# Patient Record
Sex: Female | Born: 1965 | Race: Black or African American | Hispanic: No | Marital: Married | Smoking: Never smoker
Health system: Southern US, Community
[De-identification: ages and names within clinical notes are randomized; demographics above are authoritative.]

## PROBLEM LIST (undated history)

## (undated) DIAGNOSIS — E7800 Pure hypercholesterolemia, unspecified: Secondary | ICD-10-CM

## (undated) DIAGNOSIS — E119 Type 2 diabetes mellitus without complications: Secondary | ICD-10-CM

## (undated) DIAGNOSIS — F319 Bipolar disorder, unspecified: Secondary | ICD-10-CM

## (undated) DIAGNOSIS — F32A Depression, unspecified: Secondary | ICD-10-CM

## (undated) DIAGNOSIS — F419 Anxiety disorder, unspecified: Secondary | ICD-10-CM

## (undated) HISTORY — DX: Pure hypercholesterolemia, unspecified: E78.00

## (undated) HISTORY — DX: Type 2 diabetes mellitus without complications: E11.9

## (undated) HISTORY — PX: ABDOMINAL HYSTERECTOMY: SHX81

## (undated) HISTORY — DX: Depression, unspecified: F32.A

---

## 2018-01-11 DIAGNOSIS — K219 Gastro-esophageal reflux disease without esophagitis: Secondary | ICD-10-CM | POA: Insufficient documentation

## 2018-01-11 DIAGNOSIS — F319 Bipolar disorder, unspecified: Secondary | ICD-10-CM | POA: Insufficient documentation

## 2018-09-01 DIAGNOSIS — E1165 Type 2 diabetes mellitus with hyperglycemia: Secondary | ICD-10-CM | POA: Insufficient documentation

## 2018-10-26 DIAGNOSIS — E782 Mixed hyperlipidemia: Secondary | ICD-10-CM | POA: Insufficient documentation

## 2021-07-02 ENCOUNTER — Ambulatory Visit: Payer: Self-pay | Admitting: Urology

## 2021-07-08 ENCOUNTER — Ambulatory Visit (INDEPENDENT_AMBULATORY_CARE_PROVIDER_SITE_OTHER): Payer: Medicare Other | Admitting: Urology

## 2021-07-08 ENCOUNTER — Encounter: Payer: Self-pay | Admitting: Urology

## 2021-07-08 ENCOUNTER — Other Ambulatory Visit: Payer: Self-pay

## 2021-07-08 VITALS — BP 114/73 | HR 94 | Ht 62.0 in | Wt 127.0 lb

## 2021-07-08 DIAGNOSIS — R3129 Other microscopic hematuria: Secondary | ICD-10-CM | POA: Diagnosis not present

## 2021-07-08 DIAGNOSIS — N3941 Urge incontinence: Secondary | ICD-10-CM | POA: Insufficient documentation

## 2021-07-08 LAB — URINALYSIS, ROUTINE W REFLEX MICROSCOPIC
Bilirubin, UA: NEGATIVE
Glucose, UA: NEGATIVE
Leukocytes,UA: NEGATIVE
Nitrite, UA: NEGATIVE
Specific Gravity, UA: >1.03 — ABNORMAL HIGH (ref 1.005–1.030)
Urobilinogen, Ur: 1 mg/dL (ref 0.2–1.0)
pH, UA: 6 (ref 5.0–7.5)

## 2021-07-08 LAB — MICROSCOPIC EXAMINATION: Renal Epithel, UA: NONE SEEN /hpf

## 2021-07-08 LAB — BLADDER SCAN AMB NON-IMAGING: Scan Result: 1

## 2021-07-08 MED ORDER — MIRABEGRON ER 50 MG PO TB24: 50.0000 mg | ORAL_TABLET | Freq: Every day | ORAL | 0 refills | Status: DC

## 2021-07-08 NOTE — Progress Notes (Signed)
Assessment: 1. Urge incontinence of urine   2. Microscopic hematuria      Plan: Diagnosis and management of urge incontinence discussed with the patient in detail today.  Options for management including avoidance of dietary irritants, behavioral modification, medical therapy, neuromodulation, and chemodenervation reviewed.  Information provided. Recommend a trial of Myrbetriq 50 mg daily.  Samples provided. Discontinue oxybutynin. Today for discussion patient regarding the findings of microscopic hematuria including the implications and differential diagnoses associated with it.  I also discussed recommendations for further evaluation including the rationale for upper tract imaging and cystoscopy.  I discussed the nature of these procedures including potential risk and complications.  The patient expressed an understanding of these issues. Will repeat urinalysis at next visit and discuss further evaluation if microscopic hematuria persists. Return to office in 1 month.   Chief Complaint:  Chief Complaint  Patient presents with   Urinary Incontinence    History of Present Illness:  Laura Suarez is a 55 y.o. year old female who is seen in consultation from Bertram Savin, MD for evaluation of urinary incontinence.  She reports a history of bedwetting as a child.  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  She is now having significant urgency and urge incontinence.  She denies any frequency.  She has nocturia 1-2 times.  No dysuria or gross hematuria.  No recent UTIs.  She is not using pads at the present time.  She is not having any stress urinary incontinence.  She has been on oxybutynin 5 mg 3 times daily for approximately 1 month.  She has not seen any significant change in her incontinence.  She does report a dry mouth.  No problems with constipation or fecal incontinence.  No neurologic defects.   Past Medical History:  Past Medical History:  Diagnosis Date    Depression    Diabetes mellitus without complication (HCC)    High cholesterol     Past Surgical History:  Past Surgical History:  Procedure Laterality Date   ABDOMINAL HYSTERECTOMY      Allergies:  Allergies  Allergen Reactions   Ziprasidone Hcl Other (See Comments)    Unable to move limbs Unable to move limbs    Sulfa Antibiotics Rash    Family History:  History reviewed. No pertinent family history.  Social History:  Social History   Tobacco Use   Smoking status: Never   Smokeless tobacco: Never    Review of symptoms:  Constitutional:  Negative for unexplained weight loss, night sweats, fever, chills ENT:  Negative for nose bleeds, sinus pain, painful swallowing CV:  Negative for chest pain, shortness of breath, exercise intolerance, palpitations, loss of consciousness Resp:  Negative for cough, wheezing, shortness of breath GI:  Negative for nausea, vomiting, diarrhea, bloody stools GU:  Positives noted in HPI; otherwise negative for gross hematuria, dysuria Neuro:  Negative for seizures, poor balance, limb weakness, slurred speech Psych:  Negative for lack of energy, depression, anxiety Endocrine:  Negative for polydipsia, polyuria, symptoms of hypoglycemia (dizziness, hunger, sweating) Hematologic:  Negative for anemia, purpura, petechia, prolonged or excessive bleeding, use of anticoagulants  Allergic:  Negative for difficulty breathing or choking as a result of exposure to anything; no shellfish allergy; no allergic response (rash/itch) to materials, foods  Physical exam: BP 114/73   Pulse 94   Ht 5\' 2"  (1.575 m)   Wt 127 lb (57.6 kg)   BMI 23.23 kg/m  GENERAL APPEARANCE:  Well appearing, well developed, well nourished,  NAD HEENT: Atraumatic, Normocephalic, oropharynx clear. NECK: Supple without lymphadenopathy or thyromegaly. LUNGS: Clear to auscultation bilaterally. HEART: Regular Rate and Rhythm without murmurs, gallops, or rubs. ABDOMEN: Soft,  non-tender, No Masses. EXTREMITIES: Moves all extremities well.  Without clubbing, cyanosis, or edema. NEUROLOGIC:  Alert and oriented x 3, normal gait, CN II-XII grossly intact.  MENTAL STATUS:  Appropriate. BACK:  Non-tender to palpation.  No CVAT SKIN:  Warm, dry and intact.    Results: Results for orders placed or performed in visit on 07/08/21 (from the past 24 hour(s))  BLADDER SCAN AMB NON-IMAGING   Collection Time: 07/08/21  9:45 AM  Result Value Ref Range   Scan Result 1   Microscopic Examination   Collection Time: 07/08/21 10:19 AM   Urine  Result Value Ref Range   WBC, UA 0-5 0 - 5 /hpf   RBC 3-10 (A) 0 - 2 /hpf   Epithelial Cells (non renal) 0-10 0 - 10 /hpf   Renal Epithel, UA None seen None seen /hpf   Mucus, UA Present Not Estab.   Bacteria, UA Few (A) None seen/Few  Urinalysis, Routine w reflex microscopic   Collection Time: 07/08/21 10:19 AM  Result Value Ref Range   Specific Gravity, UA >1.030 (H) 1.005 - 1.030   pH, UA 6.0 5.0 - 7.5   Color, UA Yellow Yellow   Appearance Ur Clear Clear   Leukocytes,UA Negative Negative   Protein,UA 1+ (A) Negative/Trace   Glucose, UA Negative Negative   Ketones, UA Trace (A) Negative   RBC, UA 2+ (A) Negative   Bilirubin, UA Negative Negative   Urobilinogen, Ur 1.0 0.2 - 1.0 mg/dL   Nitrite, UA Negative Negative   Microscopic Examination See below:

## 2021-07-08 NOTE — Progress Notes (Signed)
post void residual=1  Urological Symptom Review  Patient is experiencing the following symptoms: Hard to postpone urination   Review of Systems  Gastrointestinal (upper)  : Negative for upper GI symptoms  Gastrointestinal (lower) : Negative for lower GI symptoms  Constitutional : Night Sweats  Skin: Negative for skin symptoms  Eyes: Negative for eye symptoms  Ear/Nose/Throat : Negative for Ear/Nose/Throat symptoms  Hematologic/Lymphatic: Negative for Hematologic/Lymphatic symptoms  Cardiovascular : Negative for cardiovascular symptoms  Respiratory : Negative for respiratory symptoms  Endocrine: Negative for endocrine symptoms  Musculoskeletal: Negative for musculoskeletal symptoms  Neurological: Negative for neurological symptoms  Psychologic: Depression Anxiety

## 2021-08-10 ENCOUNTER — Other Ambulatory Visit: Payer: Self-pay

## 2021-08-10 ENCOUNTER — Ambulatory Visit (INDEPENDENT_AMBULATORY_CARE_PROVIDER_SITE_OTHER): Payer: Medicare Other | Admitting: Urology

## 2021-08-10 ENCOUNTER — Encounter: Payer: Self-pay | Admitting: Urology

## 2021-08-10 VITALS — BP 129/82 | HR 106

## 2021-08-10 DIAGNOSIS — R3129 Other microscopic hematuria: Secondary | ICD-10-CM | POA: Diagnosis not present

## 2021-08-10 DIAGNOSIS — N3941 Urge incontinence: Secondary | ICD-10-CM | POA: Diagnosis not present

## 2021-08-10 LAB — MICROSCOPIC EXAMINATION: Renal Epithel, UA: NONE SEEN /hpf

## 2021-08-10 LAB — URINALYSIS, ROUTINE W REFLEX MICROSCOPIC
Bilirubin, UA: NEGATIVE
Glucose, UA: NEGATIVE
Leukocytes,UA: NEGATIVE
Nitrite, UA: NEGATIVE
Specific Gravity, UA: >1.03 — ABNORMAL HIGH (ref 1.005–1.030)
Urobilinogen, Ur: 1 mg/dL (ref 0.2–1.0)
pH, UA: 6 (ref 5.0–7.5)

## 2021-08-10 MED ORDER — SOLIFENACIN SUCCINATE 10 MG PO TABS: 10.0000 mg | ORAL_TABLET | Freq: Every day | ORAL | 11 refills | Status: DC

## 2021-08-10 NOTE — Progress Notes (Signed)
Urological Symptom Review  Patient is experiencing the following symptoms: ? Get up at night ? Leakage of urine  Review of Systems  Gastrointestinal (upper)  : Negative for upper GI symptoms  Gastrointestinal (lower) : Negative for lower GI symptoms  Constitutional : Negative for symptoms  Skin: Negative for skin symptoms  Eyes: Negative for eye symptoms  Ear/Nose/Throat : Sore throat  Hematologic/Lymphatic: Negative for Hematologic/Lymphatic symptoms  Cardiovascular : Negative for cardiovascular symptoms  Respiratory : Negative for respiratory symptoms  Endocrine: Negative for endocrine symptoms  Musculoskeletal: Negative for musculoskeletal symptoms  Neurological: Negative for neurological symptoms  Psychologic: Depression Anxiety

## 2021-08-10 NOTE — Progress Notes (Signed)
   Assessment: 1. Microscopic hematuria   2. Urge incontinence of urine     Plan: Discontinue Myrbetriq. Trial of Vesicare 10 mg daily.  Prescription sent.  Use and side effects discussed. Today I had a discussion with the patient regarding the findings of microscopic hematuria including the implications and differential diagnoses associated with it.  I also discussed recommendations for further evaluation including the rationale for upper tract imaging and cystoscopy.  I discussed the nature of these procedures including potential risk and complications.  The patient expressed an understanding of these issues. Schedule for CT hematuria protocol and cystoscopy BMP today   Chief Complaint: Chief Complaint  Patient presents with   Urinary Incontinence     HPI: Laura Suarez is a 55 y.o. female who presents for continued evaluation of urinary incontinence.  She reports a history of bedwetting as a child.  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  At her visit in 9/22, she reported significant urgency and urge incontinence.  She denied any frequency.  She reported nocturia 1-2 times.  No dysuria or gross hematuria.  No recent UTIs.  She was not using pads and was not having any stress urinary incontinence.  She had been on oxybutynin 5 mg 3 times daily for approximately 1 month without any significant change in her incontinence.  She reported a dry mouth.  No problems with constipation or fecal incontinence.  No neurologic defects. She was given a trial of Myrbetriq 50 mg daily 9/22. She noted some slight improvement in her urgency but continued to have urge incontinence.  No side effects from the medication.  Her urinalysis on 07/08/2021 showed 3-10 RBCs. No recent dysuria or gross hematuria. She does have a history of tobacco use smoking 2 packs/day for approximately 40 years.   Portions of the above documentation were copied from a prior visit for review purposes  only.  Allergies: Allergies  Allergen Reactions   Ziprasidone Hcl Other (See Comments)    Unable to move limbs Unable to move limbs    Sulfa Antibiotics Rash    PMH: Past Medical History:  Diagnosis Date   Depression    Diabetes mellitus without complication (HCC)    High cholesterol     PSH: Past Surgical History:  Procedure Laterality Date   ABDOMINAL HYSTERECTOMY      SH: Social History   Tobacco Use   Smoking status: Never   Smokeless tobacco: Never    ROS: Constitutional:  Negative for fever, chills, weight loss CV: Negative for chest pain, previous MI, hypertension Respiratory:  Negative for shortness of breath, wheezing, sleep apnea, frequent cough GI:  Negative for nausea, vomiting, bloody stool, GERD  PE: BP 129/82   Pulse (!) 106  GENERAL APPEARANCE:  Well appearing, well developed, well nourished, NAD HEENT:  Atraumatic, normocephalic, oropharynx clear NECK:  Supple without lymphadenopathy or thyromegaly ABDOMEN:  Soft, non-tender, no masses EXTREMITIES:  Moves all extremities well, without clubbing, cyanosis, or edema NEUROLOGIC:  Alert and oriented x 3, normal gait, CN II-XII grossly intact MENTAL STATUS:  appropriate BACK:  Non-tender to palpation, No CVAT SKIN:  Warm, dry, and intact   Results: U/A:  3-10 RBCs

## 2021-08-11 LAB — BASIC METABOLIC PANEL
BUN/Creatinine Ratio: 10 (ref 9–23)
BUN: 6 mg/dL (ref 6–24)
CO2: 21 mmol/L (ref 20–29)
Calcium: 9.2 mg/dL (ref 8.7–10.2)
Chloride: 104 mmol/L (ref 96–106)
Creatinine, Ser: 0.58 mg/dL (ref 0.57–1.00)
Glucose: 133 mg/dL — ABNORMAL HIGH (ref 70–99)
Potassium: 4.1 mmol/L (ref 3.5–5.2)
Sodium: 144 mmol/L (ref 134–144)
eGFR: 107 mL/min/{1.73_m2} (ref >59)

## 2021-09-02 ENCOUNTER — Other Ambulatory Visit: Payer: Medicare Other | Admitting: Urology

## 2021-09-15 ENCOUNTER — Ambulatory Visit (HOSPITAL_COMMUNITY)
Admission: RE | Admit: 2021-09-15 | Discharge: 2021-09-15 | Disposition: A | Discharge status: Home / Self Care | Payer: Medicare Other | Source: Ambulatory Visit | Attending: Urology | Admitting: Urology

## 2021-09-15 ENCOUNTER — Other Ambulatory Visit: Payer: Self-pay

## 2021-09-15 DIAGNOSIS — R3129 Other microscopic hematuria: Secondary | ICD-10-CM | POA: Insufficient documentation

## 2021-09-15 LAB — POCT I-STAT CREATININE: Creatinine, Ser: 0.7 mg/dL (ref 0.44–1.00)

## 2021-09-15 MED ORDER — IOHEXOL 300 MG/ML  SOLN: 100.0000 mL | Freq: Once | INTRAMUSCULAR | Status: DC | PRN

## 2021-09-21 ENCOUNTER — Ambulatory Visit (INDEPENDENT_AMBULATORY_CARE_PROVIDER_SITE_OTHER): Payer: Medicare Other | Admitting: Urology

## 2021-09-21 ENCOUNTER — Encounter: Payer: Self-pay | Admitting: Urology

## 2021-09-21 ENCOUNTER — Other Ambulatory Visit: Payer: Self-pay

## 2021-09-21 VITALS — BP 115/81 | HR 102

## 2021-09-21 DIAGNOSIS — R3129 Other microscopic hematuria: Secondary | ICD-10-CM | POA: Diagnosis not present

## 2021-09-21 DIAGNOSIS — E278 Other specified disorders of adrenal gland: Secondary | ICD-10-CM | POA: Insufficient documentation

## 2021-09-21 DIAGNOSIS — N3941 Urge incontinence: Secondary | ICD-10-CM

## 2021-09-21 MED ORDER — CIPROFLOXACIN HCL 500 MG PO TABS
500.0000 mg | ORAL_TABLET | Freq: Once | ORAL | Status: AC
Start: 2021-09-21 — End: 2021-09-21
  Administered 2021-09-21: 500 mg via ORAL

## 2021-09-21 NOTE — Addendum Note (Signed)
Addended by: Winn Jock on: 09/21/2021 01:15 PM   Modules accepted: Orders

## 2021-09-21 NOTE — Progress Notes (Signed)
Assessment: 1. Microscopic hematuria   2. Urge incontinence of urine   3. Left adrenal mass [facility])     Plan: I personally reviewed the CT study from 09/15/2021 showing a left adrenal mass.  Results discussed with the patient today. Advised the patient that her cystoscopy evaluation was normal. Continue trial of Vesicare 10 mg daily.  Lab evaluation for left adrenal mass:  plasma metanephrines, 24 hour urinary free cortisol Return to office in 4-6 weeks.  Chief Complaint: Chief Complaint  Patient presents with   Hematuria     HPI: Laura Suarez is a 55 y.o. female who presents for continued evaluation of microscopic hematuria and urinary incontinence.  She reports a history of bedwetting as a child.  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  At her visit in 9/22, she reported significant urgency and urge incontinence.  She denied any frequency.  She reported nocturia 1-2 times.  No dysuria or gross hematuria.  No recent UTIs.  She was not using pads and was not having any stress urinary incontinence.  She had been on oxybutynin 5 mg 3 times daily for approximately 1 month without any significant change in her incontinence.  She reported a dry mouth.  No problems with constipation or fecal incontinence.  No neurologic defects. She was given a trial of Myrbetriq 50 mg daily 9/22. She noted some slight improvement in her urgency but continued to have urge incontinence.  No side effects from the medication. She was changed to Vesicare 10 mg daily at her visit in 10/22.  Her urinalysis on 07/08/2021 showed 3-10 RBCs. No recent dysuria or gross hematuria. She does have a history of tobacco use smoking 2 packs/day for approximately 40 years. Repeat urinalysis from 08/10/2021 again showed 3-10 RBCs. 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  She presents today for further evaluation of the microscopic hematuria with cystoscopy. No recent dysuria or gross hematuria.  She continues to have some urgency.   Portions of the above documentation were copied from a prior visit for review purposes only.  Allergies: Allergies  Allergen Reactions   Ziprasidone Hcl Other (See Comments)    Unable to move limbs Unable to move limbs    Sulfa Antibiotics Rash    PMH: Past Medical History:  Diagnosis Date   Depression    Diabetes mellitus without complication (HCC)    High cholesterol     PSH: Past Surgical History:  Procedure Laterality Date   ABDOMINAL HYSTERECTOMY      SH: Social History   Tobacco Use   Smoking status: Never   Smokeless tobacco: Never    ROS: Constitutional:  Negative for fever, chills, weight loss CV: Negative for chest pain, previous MI, hypertension Respiratory:  Negative for shortness of breath, wheezing, sleep apnea, frequent cough GI:  Negative for nausea, vomiting, bloody stool, GERD  PE: BP 115/81   Pulse (!) 102  GENERAL APPEARANCE:  Well appearing, well developed, well nourished, NAD HEENT:  Atraumatic, normocephalic, oropharynx clear NECK:  Supple without lymphadenopathy or thyromegaly ABDOMEN:  Soft, non-tender, no masses EXTREMITIES:  Moves all extremities well, without clubbing, cyanosis, or edema NEUROLOGIC:  Alert and oriented x 3, normal gait, CN II-XII grossly intact MENTAL STATUS:  appropriate BACK:  Non-tender to palpation,  No CVAT SKIN:  Warm, dry, and intact   Results: U/A:  11-30 RBC, 0-5 WBC  Procedure:  Flexible Cystourethroscopy  Pre-operative Diagnosis: Microscopic hematuria  Post-operative Diagnosis: Microscopic hematuria  Anesthesia:  local with lidocaine jelly  Surgical Narrative:  After appropriate informed consent was  obtained, the patient was prepped and draped in the usual sterile fashion in the supine position.  He was correctly identified and the proper procedure delineated prior to proceeding.  Sterile lidocaine gel was instilled in the urethra. The flexible cystoscope was introduced without difficulty.  Findings:  Urethra: Normal  Bladder: Normal  Ureteral orifices: normal  Additional findings: None  Saline bladder wash for cytology was not performed.    The cystoscope was then removed.  The patient tolerated the procedure well.

## 2021-09-21 NOTE — Progress Notes (Signed)
00Urological Symptom Review  Patient is experiencing the following symptoms: Hard to postpone urination   Review of Systems  Gastrointestinal (upper)  : Vomiting  Gastrointestinal (lower) : Negative for lower GI symptoms  Constitutional : Fatigue  Skin: Negative for skin symptoms  Eyes: Negative for eye symptoms  Ear/Nose/Throat : Negative for Ear/Nose/Throat symptoms  Hematologic/Lymphatic: Negative for Hematologic/Lymphatic symptoms  Cardiovascular : Negative for cardiovascular symptoms  Respiratory : Negative for respiratory symptoms  Endocrine: Negative for endocrine symptoms  Musculoskeletal: Negative for musculoskeletal symptoms  Neurological: Negative for neurological symptoms  Psychologic: Negative for psychiatric symptoms

## 2021-09-22 LAB — URINALYSIS, ROUTINE W REFLEX MICROSCOPIC
Bilirubin, UA: NEGATIVE
Glucose, UA: NEGATIVE
Leukocytes,UA: NEGATIVE
Nitrite, UA: NEGATIVE
Specific Gravity, UA: >1.03 — ABNORMAL HIGH (ref 1.005–1.030)
Urobilinogen, Ur: 0.2 mg/dL (ref 0.2–1.0)
pH, UA: 6 (ref 5.0–7.5)

## 2021-09-22 LAB — MICROSCOPIC EXAMINATION: Renal Epithel, UA: NONE SEEN /hpf

## 2021-09-24 DIAGNOSIS — K635 Polyp of colon: Secondary | ICD-10-CM | POA: Insufficient documentation

## 2021-09-24 DIAGNOSIS — K6389 Other specified diseases of intestine: Secondary | ICD-10-CM | POA: Insufficient documentation

## 2021-09-26 LAB — METANEPHRINES, PLASMA
Metanephrine, Free: 35.2 pg/mL (ref 0.0–88.0)
Normetanephrine, Free: 162.4 pg/mL (ref 0.0–244.0)

## 2021-10-03 LAB — CORTISOL, URINE, FREE
Cortisol (Ur), Free: 34 ug/24 hr (ref 6–42)
Cortisol,F,ug/L,U: 32 ug/L

## 2021-10-06 ENCOUNTER — Telehealth: Payer: Self-pay

## 2021-10-06 NOTE — Telephone Encounter (Signed)
-----   Message from Milderd Meager, MD sent at 10/06/2021  8:46 AM EST ----- Please notify the patient that her lab work is normal.   This would suggest that the adrenal mass is nonfunctioning and likely benign.  No additional evaluation indicated at this time.   Keep follow-up as scheduled.

## 2021-10-06 NOTE — Telephone Encounter (Signed)
Patient called and notified. Voiced understanding. Appt rescheduled due to schedule conflict with patient.

## 2021-10-08 ENCOUNTER — Other Ambulatory Visit: Payer: Self-pay

## 2021-10-08 ENCOUNTER — Ambulatory Visit (INDEPENDENT_AMBULATORY_CARE_PROVIDER_SITE_OTHER): Payer: Medicare Other | Admitting: Pulmonary Disease

## 2021-10-08 ENCOUNTER — Encounter: Payer: Self-pay | Admitting: Pulmonary Disease

## 2021-10-08 VITALS — BP 118/64 | HR 87 | Temp 98.0°F | Ht 62.0 in | Wt 117.4 lb

## 2021-10-08 DIAGNOSIS — R042 Hemoptysis: Secondary | ICD-10-CM

## 2021-10-08 NOTE — Progress Notes (Signed)
@Patient  ID: Laura Suarez, female    DOB: 07/23/1966, 55 y.o.   MRN: [id number]  Chief Complaint  Patient presents with   Consult    Consult for hemoptysis. Had it for 2 weeks. Pt states that she is no longer coughing up blood for almost two weeks now. She was on antibiotics. Was not triggered by anything that the patient is aware of.     Referring provider: Wilburt Finlay, MD  HPI:   55 y.o. woman whom we are seeing in consultation for evaluation of hemoptysis.  Patient was in usual state of health.  She reports a daily chronic cough productive of phlegm for 10+ years.  She attributes to marijuana smoking.  Coughing fits worse after smoking.  Quit cigarettes in 2017.  She had worsening cough in the middle of November 2022.  Denies any fever.  Developed blood in her sputum.  This prompted ED evaluation at [facility] in [place].  CTA PE protocol was obtained that on my review interpretation reveals groundglass opacity right upper lobe, emphysematous changes, thickened bronchioles, no evidence of PE, otherwise clear lungs.  She was treated with antibiotics per her report.  Hemoptysis has resolved.  The severity of cough has resolved.  She continues her daily chronic cough productive of sputum.  No other environmental or seasonal factors she identified that makes the cough better or worse.  No medications made the cough better or worse.  No positions or time of day to make a cough better or worse.  No other relieving or exacerbating factors.  PMH: Diabetes, hyperlipidemia Surgical history: Hysterectomy Family history: Reviewed, denies significant rest or illness in first-degree relative Social history: Former cigarette smoker, quit 2017, daily marijuana smoker, lives in [place] / Pulmonary Flowsheets:   ACT:  No flowsheet data found.  MMRC: No flowsheet data found.  Epworth:  No flowsheet data found.  Tests:   FENO:  No results found for:  NITRICOXIDE  PFT: No flowsheet data found.  WALK:  No flowsheet data found.  Imaging: Personally reviewed as per EMR discussion in this note CT HEMATURIA WORKUP  Result Date: 09/15/2021 CLINICAL DATA:  Microscopic hematuria with urge incontinence and adult nocturnal enuresis EXAM: CT ABDOMEN AND PELVIS WITHOUT AND WITH CONTRAST TECHNIQUE: Multidetector CT imaging of the abdomen and pelvis was performed following the standard protocol before and following the bolus administration of intravenous contrast. CONTRAST:  100 cc of Omnipaque 300 COMPARISON:  Chest CT August 23, 2021 FINDINGS: Lower chest: Bibasilar atelectasis. Hepatobiliary: No suspicious hepatic lesion. Hyperenhancing 7 mm focus in the anterior liver on image 12/6 which is isoattenuating to background parenchyma on non contrasted and delayed imaging. A 10 mm hyperenhancing focus in the posterior left hepatic lobe on image 14/7 demonstrates similar imaging characteristics. Gallbladder is unremarkable. No biliary ductal dilation. Pancreas: No pancreatic ductal dilation or evidence of acute inflammation. Spleen: Within normal limits. Adrenals/Urinary Tract: Enhancing intrinsically hypodense 2.2 cm left adrenal nodule on image 18/7 is consistent with an adrenal adenoma. Right adrenal thickening without discrete nodularity, favor hyperplasia. No hydronephrosis. No renal, ureteral or bladder calculi identified. No solid enhancing renal mass. Tiny bilateral hypodense renal lesions are technically too small to accurately characterize but statistically likely to reflect cysts. Bilateral kidneys demonstrate symmetric enhancement and excretion of contrast material. No collecting system duplication. No suspicious filling defect visualized within the opacified portions of the collecting systems or ureters on delayed imaging. Mild symmetric wall thickening of the urinary bladder.  Evaluation of the urinary bladder is somewhat limited by urine contamination  within this context there is no asymmetric urinary bladder wall thickening or suspicious intraluminal filling defect visualized. Stomach/Bowel: No enteric contrast was administered. Small hiatal hernia otherwise the stomach is unremarkable for degree of distension. No pathologic dilation of large or small bowel. The appendix and terminal ileum appear normal. Colonic diverticulosis without findings of acute diverticulitis. Vascular/Lymphatic: Scattered aortic and branch vessel atherosclerosis without abdominal aortic aneurysm. No pathologically enlarged abdominal or pelvic lymph nodes identified. Reproductive: Status post hysterectomy. No adnexal masses. Other: No significant abdominopelvic free fluid. Musculoskeletal: No acute or significant osseous findings. IMPRESSION: 1. Mild symmetric wall thickening of the urinary bladder, which can be seen with cystitis. Correlation with urinalysis is recommended. 2. No hydronephrosis. No renal, ureteral, or bladder calculi identified. 3. No solid enhancing renal mass. 4. Two hyperenhancing hepatic lesions measuring up to 10 mm, most likely reflect benign flash filling hemangiomas. 5. Benign 2.2 cm left adrenal adenoma, consider biochemical analysis to assess for lesion functionality. 6. Colonic diverticulosis without findings of acute diverticulitis. 7.  Aortic Atherosclerosis (ICD10-I70.0). Electronically Signed   By: Maudry Mayhew M.D.   On: 09/15/2021 18:23    Lab Results: Personally reviewed CBC No results found for: WBC, RBC, HGB, HCT, PLT, MCV, MCH, MCHC, RDW, LYMPHSABS, MONOABS, EOSABS, BASOSABS  BMET    Component Value Date/Time   NA 144 08/10/2021 0000   K 4.1 08/10/2021 0000   CL 104 08/10/2021 0000   CO2 21 08/10/2021 0000   GLUCOSE 133 (H) 08/10/2021 0000   BUN 6 08/10/2021 0000   CREATININE 0.70 09/15/2021 1058   CALCIUM 9.2 08/10/2021 0000    BNP No results found for: BNP  ProBNP No results found for: PROBNP  Specialty Problems    None   Allergies  Allergen Reactions   Ziprasidone Hcl Other (See Comments)    Unable to move limbs Unable to move limbs    Sulfa Antibiotics Rash    Immunization History  Administered Date(s) Administered   Influenza Split 08/04/2015   Influenza,inj,Quad PF,6+ Mos 07/24/2016, 07/28/2017, 07/28/2017, 07/27/2020, 07/07/2021   Influenza,inj,quad, With Preservative 08/04/2015, 07/01/2018   PFIZER(Purple Top)SARS-COV-2 Vaccination 12/24/2019, 01/14/2020   Pneumococcal Conjugate-13 01/24/2018   Pneumococcal Polysaccharide-23 07/17/2014, 07/01/2018   Pneumococcal-Unspecified 07/17/2014    Past Medical History:  Diagnosis Date   Depression    Diabetes mellitus without complication (HCC)    High cholesterol     Tobacco History: Social History   Tobacco Use  Smoking Status Never  Smokeless Tobacco Never   Counseling given: Not Answered   Continue to not smoke  Outpatient Encounter Medications as of 10/08/2021  Medication Sig   ALPRAZolam (XANAX) 0.5 MG tablet Take 1 tablet by mouth daily as needed.   aspirin 81 MG EC tablet Take by mouth.   atorvastatin (LIPITOR) 40 MG tablet Take 40 mg by mouth at bedtime.   Biotin 1 MG CAPS Take by mouth.   Coenzyme Q10 100 MG CHEW Chew by mouth.   Doxepin HCl 3 MG TABS Take 1 tablet by mouth at bedtime.   hydrOXYzine (ATARAX/VISTARIL) 10 MG tablet Take 10 mg by mouth at bedtime.   LANTUS SOLOSTAR 100 UNIT/ML Solostar Pen SMARTSIG:36 Unit(s) SUB-Q Every Night   metFORMIN (GLUCOPHAGE) 1000 MG tablet Take 1,000 mg by mouth 2 (two) times daily.   Multiple Vitamins-Minerals (ONCOVITE) TABS Take 1 tablet by mouth daily.   omeprazole (PRILOSEC) 40 MG capsule Take 40 mg by mouth daily.  OXcarbazepine (TRILEPTAL) 150 MG tablet Take 300 mg by mouth at bedtime.   PARoxetine (PAXIL) 40 MG tablet Take 40 mg by mouth daily.   risperiDONE (RISPERDAL) 1 MG tablet Take by mouth.   solifenacin (VESICARE) 10 MG tablet Take 1 tablet (10 mg total)  by mouth daily.   traZODone (DESYREL) 150 MG tablet Take 150 mg by mouth at bedtime.   TRULICITY 1.5 0000000 SOPN Inject into the skin.   vitamin B-12 (CYANOCOBALAMIN) 500 MCG tablet Take by mouth.   No facility-administered encounter medications on file as of 10/08/2021.     Review of Systems  Review of Systems  No chest with exertion.  No orthopnea or PND.  No lower extremity swelling.  Comprehensive review of systems otherwise negative. Physical Exam  BP 118/64 (BP Location: Left Arm, Patient Position: Sitting, Cuff Size: Normal)    Pulse 87    Temp 98 F (36.7 C) (Oral)    Ht 5\' 2"  (1.575 m)    Wt 117 lb 6.4 oz (53.3 kg)    SpO2 97%    BMI 21.47 kg/m   Wt Readings from Last 5 Encounters:  10/08/21 117 lb 6.4 oz (53.3 kg)  07/08/21 127 lb (57.6 kg)    BMI Readings from Last 5 Encounters:  10/08/21 21.47 kg/m  07/08/21 23.23 kg/m     Physical Exam General: Well-appearing, no acute distress Eyes: EOMI, no icterus Neck: Supple, no JVP Pulmonary: Clear, no work of breathing Cardiovascular regular rate and rhythm, no murmur Abdomen: Nondistended, bowel sounds present MSK: No synovitis, no joint effusion Neuro: Normal gait, no weakness Psych: Normal mood, full affect   Assessment & Plan:   Hemoptysis: Mid 08/2021.  CT scan at [facility] reviewed with mildly groundglass opacity right upper lobe.  No evidence of mass or malignancy on my review.  Suspect in the setting of chronic bronchitis exacerbated by mild pneumonia.  Treated with antibiotics.  Hemoptysis now resolved.  Repeat CT scan planned January 2023 to evaluate resolution of mild pneumonia changes, GGO on CT scan in November 2022  Chronic bronchitis: Daily productive cough for over 10 years.  Related to marijuana smoking.  She was encouraged to discontinue marijuana smoking, use edibles if desired.  Offered trial of bronchodilator therapy to improve cough which he declines today.   Return if symptoms  worsen or fail to improve.   Lanier Clam, MD 10/08/2021

## 2021-10-08 NOTE — Patient Instructions (Signed)
Nice to meet you  We will repeat a CT scan in next week or 2 at any plan to make sure the small area of pneumonia has improved  I see no evidence of mass or cancer on the CT scan performed in November 2022.  Most likely you have chronic bronchitis and at times small blood vessels can pop and cause blood to be coughed up.  Likely was worse or magnified with what may have been a small pneumonia at that time.  If the CT scan looks okay, no further follow-up needed.  Return to clinic as needed.  If we need to follow-up on something we will contact you and arrange follow-up.

## 2021-10-12 NOTE — Addendum Note (Signed)
Encounter addended by: Wilmer Floor B on: 10/12/2021 7:22 AM  Actions taken: Imaging Exam ended, Charge Capture section accepted

## 2021-10-20 ENCOUNTER — Ambulatory Visit: Payer: Medicare Other | Admitting: Urology

## 2021-10-21 ENCOUNTER — Ambulatory Visit (HOSPITAL_COMMUNITY)
Admission: RE | Admit: 2021-10-21 | Discharge: 2021-10-21 | Disposition: A | Discharge status: Home / Self Care | Payer: Medicare Other | Source: Ambulatory Visit | Attending: Pulmonary Disease | Admitting: Pulmonary Disease

## 2021-10-21 ENCOUNTER — Other Ambulatory Visit: Payer: Self-pay

## 2021-10-21 DIAGNOSIS — R042 Hemoptysis: Secondary | ICD-10-CM | POA: Insufficient documentation

## 2021-10-22 ENCOUNTER — Telehealth: Payer: Self-pay | Admitting: Pulmonary Disease

## 2021-10-22 DIAGNOSIS — R911 Solitary pulmonary nodule: Secondary | ICD-10-CM

## 2021-10-22 NOTE — Telephone Encounter (Signed)
This is an FYI for a call report on CT.

## 2021-10-29 ENCOUNTER — Other Ambulatory Visit: Payer: Self-pay

## 2021-10-29 ENCOUNTER — Ambulatory Visit (INDEPENDENT_AMBULATORY_CARE_PROVIDER_SITE_OTHER): Payer: Commercial Managed Care - HMO | Admitting: Urology

## 2021-10-29 ENCOUNTER — Encounter: Payer: Self-pay | Admitting: Urology

## 2021-10-29 VITALS — BP 121/80 | HR 98 | Wt 120.0 lb

## 2021-10-29 DIAGNOSIS — E278 Other specified disorders of adrenal gland: Secondary | ICD-10-CM | POA: Diagnosis not present

## 2021-10-29 DIAGNOSIS — N3941 Urge incontinence: Secondary | ICD-10-CM

## 2021-10-29 DIAGNOSIS — R3129 Other microscopic hematuria: Secondary | ICD-10-CM

## 2021-10-29 LAB — MICROSCOPIC EXAMINATION: Renal Epithel, UA: NONE SEEN /hpf

## 2021-10-29 LAB — URINALYSIS, ROUTINE W REFLEX MICROSCOPIC
Bilirubin, UA: NEGATIVE
Glucose, UA: NEGATIVE
Leukocytes,UA: NEGATIVE
Nitrite, UA: NEGATIVE
Specific Gravity, UA: >1.03 — ABNORMAL HIGH (ref 1.005–1.030)
Urobilinogen, Ur: 1 mg/dL (ref 0.2–1.0)
pH, UA: 5.5 (ref 5.0–7.5)

## 2021-10-29 NOTE — Progress Notes (Signed)
Assessment: 1. Microscopic hematuria   2. Urge incontinence of urine   3. Left adrenal mass [facility]); non-functioning adenoma     Plan: Continue Vesicare Will continue to monitor microscopic hematuria at this time given her recent negative evaluation. Results of laboratory testing for adrenal mass discussed with the patient.  No further evaluation or intervention indicated at this time. Return to office in 6 months.  Chief Complaint: Chief Complaint  Patient presents with   Hematuria   Urinary Incontinence    HPI: Laura Suarez is a 56 y.o. female who presents for continued evaluation of microscopic hematuria and urinary incontinence.  She reports a history of bedwetting as a child.  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  At her visit in 9/22, she reported significant urgency and urge incontinence.  She denied any frequency.  She reported nocturia 1-2 times.  No dysuria or gross hematuria.  No recent UTIs.  She was not using pads and was not having any stress urinary incontinence.  She had been on oxybutynin 5 mg 3 times daily for approximately 1 month without any significant change in her incontinence.  She reported a dry mouth.  No problems with constipation or fecal incontinence.  No neurologic defects. She was given a trial of Myrbetriq 50 mg daily 9/22. She noted some slight improvement in her urgency but continued to have urge incontinence.  No side effects from the medication. She was changed to Vesicare 10 mg daily at her visit in 10/22.  Her urinalysis on 07/08/2021 showed 3-10 RBCs. No recent dysuria or gross hematuria. She does have a history of tobacco use smoking 2 packs/day for approximately 40 years. Repeat urinalysis from 08/10/2021 again showed 3-10 RBCs. CT hematuria protocol from 09/15/2021 shows no renal, ureteral, or bladder calculi, no solid enhancing renal masses, tiny bilateral hypodense renal lesions statistically likely to reflect cysts, no  evidence of obstruction, and a enhancing hypodense 2.2 cm left adrenal nodule consistent with adrenal adenoma. Cystoscopy from 12/22 demonstrated a normal urethra and bladder.  She was evaluated for the left adrenal nodule with laboratory studies.  Her urine cortisol and plasma metanephrines were normal.  She returns today for follow-up.  She continues on Vesicare with improvement in her urgency and urge incontinence.  No side effects.  No dysuria or gross hematuria.  Portions of the above documentation were copied from a prior visit for review purposes only.  Allergies: Allergies  Allergen Reactions   Sulfa Antibiotics Rash   Ziprasidone Hcl Other (See Comments)    Unable to move limbs Unable to move limbs     PMH: Past Medical History:  Diagnosis Date   Depression    Diabetes mellitus without complication (HCC)    High cholesterol     PSH: Past Surgical History:  Procedure Laterality Date   ABDOMINAL HYSTERECTOMY      SH: Social History   Tobacco Use   Smoking status: Never   Smokeless tobacco: Never    ROS: Constitutional:  Negative for fever, chills, weight loss CV: Negative for chest pain, previous MI, hypertension Respiratory:  Negative for shortness of breath, wheezing, sleep apnea, frequent cough GI:  Negative for nausea, vomiting, bloody stool, GERD  PE: BP 121/80    Pulse 98    Wt 120 lb (54.4 kg)    BMI 21.95 kg/m  GENERAL APPEARANCE:  Well appearing, well developed, well nourished, NAD HEENT:  Atraumatic, normocephalic, oropharynx clear NECK:  Supple without lymphadenopathy or thyromegaly ABDOMEN:  Soft, non-tender, no masses EXTREMITIES:  Moves all extremities well, without clubbing, cyanosis, or edema NEUROLOGIC:  Alert and oriented x 3, normal gait, CN II-XII grossly intact MENTAL STATUS:  appropriate BACK:  Non-tender to palpation, No CVAT SKIN:  Warm, dry, and intact   Results: U/A:  11-30 RBC, 0-5 WBC

## 2021-10-29 NOTE — Progress Notes (Signed)

## 2021-12-03 NOTE — Telephone Encounter (Signed)
Super D CT chest from 10/21/21 impression below. Dr. Judeth Horn, please advise on results and recs. Thanks.     IMPRESSION: 1. Indeterminate solid irregular 0.8 cm medial right lower lobe pulmonary nodule, previously obscured by motion degradation and hypoventilation on 08/23/2021 scan. Primary bronchogenic carcinoma not excluded in this high risk patient. PET-CT could be considered at this time, although nodule is at the lower limits of PET resolution. Close chest CT follow-up suggested at a minimum in 3 months. 2. Previously visualized patchy ground-glass opacities in the medial right upper lobe have resolved, compatible with resolved alveolar hemorrhage in the setting of hemoptysis. 3. No thoracic adenopathy. 4. Stable left adrenal adenoma. 5. Aortic Atherosclerosis (ICD10-I70.0) and Emphysema (ICD10-J43.9).     Electronically Signed   By: Delbert Phenix M.D.   On: 10/21/2021 21:43

## 2021-12-04 NOTE — Telephone Encounter (Signed)
Called and spoke with pt about message from [facility] and she verbalized understanding. Pt has been scheduled for a f/u appt and stated to her that PCCs will call her to schedule CT prior to that appt. Nothing further needed.

## 2021-12-04 NOTE — Telephone Encounter (Signed)
Repeat CT chest early 01/2022. Order placed. Please schedule f/u with me shortly after CT scan so we can discuss results. Thanks!

## 2022-01-15 ENCOUNTER — Other Ambulatory Visit (HOSPITAL_COMMUNITY): Payer: Medicare Other

## 2022-01-18 ENCOUNTER — Ambulatory Visit (HOSPITAL_COMMUNITY)
Admission: RE | Admit: 2022-01-18 | Discharge: 2022-01-18 | Disposition: A | Discharge status: Home / Self Care | Payer: Medicare Other | Source: Ambulatory Visit | Attending: Pulmonary Disease | Admitting: Pulmonary Disease

## 2022-01-18 DIAGNOSIS — R911 Solitary pulmonary nodule: Secondary | ICD-10-CM | POA: Diagnosis present

## 2022-01-25 ENCOUNTER — Ambulatory Visit (INDEPENDENT_AMBULATORY_CARE_PROVIDER_SITE_OTHER): Payer: Medicare Other | Admitting: Pulmonary Disease

## 2022-01-25 ENCOUNTER — Encounter: Payer: Self-pay | Admitting: Pulmonary Disease

## 2022-01-25 VITALS — BP 118/68 | HR 94 | Temp 98.4°F | Ht 62.0 in | Wt 116.4 lb

## 2022-01-25 DIAGNOSIS — R911 Solitary pulmonary nodule: Secondary | ICD-10-CM

## 2022-01-25 DIAGNOSIS — R042 Hemoptysis: Secondary | ICD-10-CM

## 2022-01-25 NOTE — Progress Notes (Signed)
? ?@Patient  ID: , female    DOB: Aug 01, 1966, 56 y.o.   MRN: [id number] ? ?Chief Complaint  ?Patient presents with  ? Follow-up  ?  Follow up after CT scan.   ? ? ?Referring provider: ?[id number], MD ? ?HPI:  ? ?56 y.o. woman whom we are seeing in follow up  for evaluation of hemoptysis and lung nodule. ? ?Overall doing well.  No complaints.  Reviewed her CT scan in detail with patient today.  Stable size of nodule.  This is reassuring.  Discussed role and rationale of repeat imaging in the next 6 months per radiology recommendation.  Given her smoking history she is high risk.  If stable on next scan likely can do surveillance scan in 1 year and complete 18 to 34-month follow-up.  No further hemoptysis.  No complaints about cough dyspnea etc. ? ?HPI at initial visit:  ?Patient was in usual state of health.  She reports a daily chronic cough productive of phlegm for 10+ years.  She attributes to marijuana smoking.  Coughing fits worse after smoking.  Quit cigarettes in 2017.  She had worsening cough in the middle of November 2022.  Denies any fever.  Developed blood in her sputum.  This prompted ED evaluation at [facility] in [place].  CTA PE protocol was obtained that on my review interpretation reveals groundglass opacity right upper lobe, emphysematous changes, thickened bronchioles, no evidence of PE, otherwise clear lungs.  She was treated with antibiotics per her report.  Hemoptysis has resolved.  The severity of cough has resolved.  She continues her daily chronic cough productive of sputum.  No other environmental or seasonal factors she identified that makes the cough better or worse.  No medications made the cough better or worse.  No positions or time of day to make a cough better or worse.  No other relieving or exacerbating factors. ? ?PMH: Diabetes, hyperlipidemia ?Surgical history: Hysterectomy ?Family history: Reviewed, denies significant rest or illness in  first-degree relative ?Social history: Former cigarette smoker, quit 2017, daily marijuana smoker, lives in [place] ? ?Questionaires / Pulmonary Flowsheets:  ? ?ACT:  ?   ? View : No data to display.  ?  ?  ?  ? ? ?MMRC: ?   ? View : No data to display.  ?  ?  ?  ? ? ?Epworth:  ?   ? View : No data to display.  ?  ?  ?  ? ? ?Tests:  ? ?FENO:  ?No results found for: NITRICOXIDE ? ?PFT: ?   ? View : No data to display.  ?  ?  ?  ? ? ?WALK:  ?   ? View : No data to display.  ?  ?  ?  ? ? ?Imaging: ?Personally reviewed as per EMR discussion in this note ?CT Super D Chest Wo Contrast ? ?Result Date: 01/18/2022 ?CLINICAL DATA:  Follow-up lung nodule EXAM: CT CHEST WITHOUT CONTRAST TECHNIQUE: Multidetector CT imaging of the chest was performed using thin slice collimation for electromagnetic bronchoscopy planning purposes, without intravenous contrast. RADIATION DOSE REDUCTION: This exam was performed according to the departmental dose-optimization program which includes automated exposure control, adjustment of the mA and/or kV according to patient size and/or use of iterative reconstruction technique. COMPARISON:  CT chest dated October 21, 2021 FINDINGS: Cardiovascular: Normal heart size. Small pericardial effusion. Mild coronary artery calcifications. Mild atherosclerotic disease of the thoracic aorta. Mediastinum/Nodes: Esophagus and thyroid are  unremarkable. No pathologically enlarged lymph nodes seen in the chest. Lungs/Pleura: Central airways are patent. Mild paraseptal emphysema. No consolidation, pleural effusion or pneumothorax. Solid pulmonary nodule of the right lower lobe measures 7 x 5 mm on series 6, image 80, unchanged size when compared with prior exam and remeasured in similar plane. Upper Abdomen: Stable low-attenuation left adrenal gland nodule measuring 2.2 cm, likely a benign adenoma with no further follow-up imaging recommended for this finding. No acute abnormality. One Musculoskeletal: No chest wall  mass or suspicious bone lesions identified. IMPRESSION: 1. Stable irregular solid pulmonary nodule of the right lower lobe. Recommend additional six-month follow-up CT. 2. Aortic Atherosclerosis (ICD10-I70.0) and Emphysema (ICD10-J43.9). Electronically Signed   By: Allegra Lai M.D.   On: 01/18/2022 13:19   ? ?Lab Results: ?Personally reviewed ?CBC ?No results found for: WBC, RBC, HGB, HCT, PLT, MCV, MCH, MCHC, RDW, LYMPHSABS, MONOABS, EOSABS, BASOSABS ? ?BMET ?   ?Component Value Date/Time  ? NA 144 08/10/2021 0000  ? K 4.1 08/10/2021 0000  ? CL 104 08/10/2021 0000  ? CO2 21 08/10/2021 0000  ? GLUCOSE 133 (H) 08/10/2021 0000  ? BUN 6 08/10/2021 0000  ? CREATININE 0.70 09/15/2021 1058  ? CALCIUM 9.2 08/10/2021 0000  ? ? ?BNP ?No results found for: BNP ? ?ProBNP ?No results found for: PROBNP ? ?Specialty Problems   ?None ? ? ?Allergies  ?Allergen Reactions  ? Sulfa Antibiotics Rash  ? Ziprasidone Hcl Other (See Comments)  ?  Unable to move limbs ?Unable to move limbs ?  ? ? ?Immunization History  ?Administered Date(s) Administered  ? Influenza Split 08/04/2015  ? Influenza,inj,Quad PF,6+ Mos 07/24/2016, 07/28/2017, 07/28/2017, 07/27/2020, 07/07/2021  ? Influenza,inj,quad, With Preservative 08/04/2015, 07/01/2018  ? PFIZER(Purple Top)SARS-COV-2 Vaccination 12/24/2019, 01/14/2020  ? Pneumococcal Conjugate-13 01/24/2018  ? Pneumococcal Polysaccharide-23 07/17/2014, 07/01/2018  ? Pneumococcal-Unspecified 07/17/2014  ? ? ?Past Medical History:  ?Diagnosis Date  ? Depression   ? Diabetes mellitus without complication (HCC)   ? High cholesterol   ? ? ?Tobacco History: ?Social History  ? ?Tobacco Use  ?Smoking Status Never  ?Smokeless Tobacco Never  ? ?Counseling given: Not Answered ? ? ?Continue to not smoke ? ?Outpatient Encounter Medications as of 01/25/2022  ?Medication Sig  ? aspirin 81 MG EC tablet Take by mouth.  ? atorvastatin (LIPITOR) 40 MG tablet Take 40 mg by mouth at bedtime.  ? Biotin 1 MG CAPS Take by  mouth.  ? Coenzyme Q10 100 MG CHEW Chew by mouth.  ? hydrOXYzine (ATARAX/VISTARIL) 10 MG tablet Take 10 mg by mouth at bedtime.  ? metFORMIN (GLUCOPHAGE) 1000 MG tablet Take 1,000 mg by mouth 2 (two) times daily.  ? omeprazole (PRILOSEC) 40 MG capsule Take 40 mg by mouth daily.  ? OXcarbazepine (TRILEPTAL) 150 MG tablet Take 300 mg by mouth at bedtime.  ? PARoxetine (PAXIL) 40 MG tablet Take 40 mg by mouth daily.  ? risperiDONE (RISPERDAL) 1 MG tablet Take by mouth.  ? solifenacin (VESICARE) 10 MG tablet Take 1 tablet (10 mg total) by mouth daily.  ? traZODone (DESYREL) 150 MG tablet Take 150 mg by mouth at bedtime.  ? TRULICITY 1.5 MG/0.5ML SOPN Inject into the skin.  ? [DISCONTINUED] ALPRAZolam (XANAX) 0.5 MG tablet Take 1 tablet by mouth daily as needed.  ? [DISCONTINUED] Doxepin HCl 3 MG TABS Take 1 tablet by mouth at bedtime.  ? [DISCONTINUED] LANTUS SOLOSTAR 100 UNIT/ML Solostar Pen SMARTSIG:36 Unit(s) SUB-Q Every Night  ? [DISCONTINUED] Multiple Vitamins-Minerals (ONCOVITE)  TABS Take 1 tablet by mouth daily.  ? [DISCONTINUED] vitamin B-12 (CYANOCOBALAMIN) 500 MCG tablet Take by mouth.  ? ?No facility-administered encounter medications on file as of 01/25/2022.  ? ? ? ?Review of Systems ? ?Review of Systems  ?N/a ?Physical Exam ? ?BP 118/68 (BP Location: Left Arm, Patient Position: Sitting, Cuff Size: Normal)   Pulse 94   Temp 98.4 ?F (36.9 ?C) (Oral)   Ht 5\' 2"  (1.575 m)   Wt 116 lb 6.4 oz (52.8 kg)   SpO2 97%   BMI 21.29 kg/m?  ? ?Wt Readings from Last 5 Encounters:  ?01/25/22 116 lb 6.4 oz (52.8 kg)  ?10/29/21 120 lb (54.4 kg)  ?10/08/21 117 lb 6.4 oz (53.3 kg)  ?07/08/21 127 lb (57.6 kg)  ? ? ?BMI Readings from Last 5 Encounters:  ?01/25/22 21.29 kg/m?  ?10/29/21 21.95 kg/m?  ?10/08/21 21.47 kg/m?  ?07/08/21 23.23 kg/m?  ? ? ? ?Physical Exam ?General: Well-appearing, no acute distress ?Eyes: EOMI, no icterus ?Neck: Supple, no JVP ?Pulmonary: Clear, no work of breathing ?Cardiovascular regular rate and  rhythm, no murmur ?Abdomen: Nondistended, bowel sounds present ?MSK: No synovitis, no joint effusion ?Neuro: Normal gait, no weakness ?Psych: Normal mood, full affect ? ? ?Assessment & Plan:  ? ?Hemoptysis: Mid 11/2

## 2022-01-25 NOTE — Patient Instructions (Signed)
Nice to see you again ? ?The small spot or nodule is stable compared to January. ? ?The radiologist recommended a follow up scan in October 2023 - we will schedule the scan then call us and schedule a follow up visit with Dr. Silas Flood a few days after the scan is done. ? ?Return to clinic in 6 months orr sooner as needed ?

## 2022-04-29 ENCOUNTER — Encounter: Payer: Self-pay | Admitting: Urology

## 2022-04-29 ENCOUNTER — Ambulatory Visit (INDEPENDENT_AMBULATORY_CARE_PROVIDER_SITE_OTHER): Payer: Medicare Other | Admitting: Urology

## 2022-04-29 VITALS — BP 107/73 | HR 96

## 2022-04-29 DIAGNOSIS — N3941 Urge incontinence: Secondary | ICD-10-CM | POA: Diagnosis not present

## 2022-04-29 DIAGNOSIS — R3129 Other microscopic hematuria: Secondary | ICD-10-CM

## 2022-04-29 DIAGNOSIS — E278 Other specified disorders of adrenal gland: Secondary | ICD-10-CM

## 2022-04-29 LAB — URINALYSIS, ROUTINE W REFLEX MICROSCOPIC
Bilirubin, UA: NEGATIVE
Glucose, UA: NEGATIVE
Leukocytes,UA: NEGATIVE
Nitrite, UA: NEGATIVE
Specific Gravity, UA: >1.03 — ABNORMAL HIGH (ref 1.005–1.030)
Urobilinogen, Ur: 1 mg/dL (ref 0.2–1.0)
pH, UA: 5.5 (ref 5.0–7.5)

## 2022-04-29 LAB — MICROSCOPIC EXAMINATION
Renal Epithel, UA: NONE SEEN /hpf
WBC, UA: NONE SEEN /hpf (ref 0–5)

## 2022-04-29 MED ORDER — TROSPIUM CHLORIDE 20 MG PO TABS: 20.0000 mg | ORAL_TABLET | Freq: Two times a day (BID) | ORAL | 11 refills | Status: DC

## 2022-04-29 NOTE — Progress Notes (Signed)
Assessment: 1. Microscopic hematuria; negative evaluation 12/22   2. Urge incontinence of urine   3. Left adrenal mass (HCC); non-functioning adenoma     Plan: D/C Vesicare Trial of trospium 20 mg BID in place of Vesicare. Rx sent. Return to office in 1 month Hematuria profile next visit  Chief Complaint: Chief Complaint  Patient presents with   Hematuria    HPI: Laura Suarez is a 56 y.o. female who presents for continued evaluation of microscopic hematuria and urinary incontinence.  She reports a history of bedwetting as a child.  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  At her visit in 9/22, she reported significant urgency and urge incontinence.  She denied any frequency.  She reported nocturia 1-2 times.  No dysuria or gross hematuria.  No recent UTIs.  She was not using pads and was not having any stress urinary incontinence.  She had been on oxybutynin 5 mg 3 times daily for approximately 1 month without any significant change in her incontinence.  She reported a dry mouth.  No problems with constipation or fecal incontinence.  No neurologic defects. She was given a trial of Myrbetriq 50 mg daily 9/22. She noted some slight improvement in her urgency but continued to have urge incontinence.  No side effects from the medication. She was changed to Vesicare 10 mg daily at her visit in 10/22.  Her urinalysis on 07/08/2021 showed 3-10 RBCs. No dysuria or gross hematuria. She does have a history of tobacco use smoking 2 packs/day for approximately 40 years. Repeat urinalysis from 08/10/2021 again showed 3-10 RBCs. CT hematuria protocol from 09/15/2021 shows no renal, ureteral, or bladder calculi, no solid enhancing renal masses, tiny bilateral hypodense renal lesions statistically likely to reflect cysts, no evidence of obstruction, and a enhancing hypodense 2.2 cm left adrenal nodule consistent with adrenal adenoma. Cystoscopy from 12/22 demonstrated a normal  urethra and bladder.  She was evaluated for the left adrenal nodule with laboratory studies.  Her urine cortisol and plasma metanephrines were normal.  At her visit in January 2023, she continued on Vesicare with improvement in her urgency and urge incontinence.  No side effects.  No dysuria or gross hematuria.  She returns today for follow-up.  No hematuria or dysuria.  No flank pain.  She continues on Vesicare 10 mg daily.  She does not feel like the Vesicare is controlling her urgency and urge incontinence as well as it did initially.  No side effects from the medication.  Portions of the above documentation were copied from a prior visit for review purposes only.  Allergies: Allergies  Allergen Reactions   Sulfa Antibiotics Rash   Ziprasidone Hcl Other (See Comments)    Unable to move limbs Unable to move limbs     PMH: Past Medical History:  Diagnosis Date   Depression    Diabetes mellitus without complication (HCC)    High cholesterol     PSH: Past Surgical History:  Procedure Laterality Date   ABDOMINAL HYSTERECTOMY      SH: Social History   Tobacco Use   Smoking status: Never   Smokeless tobacco: Never    ROS: Constitutional:  Negative for fever, chills, weight loss CV: Negative for chest pain, previous MI, hypertension Respiratory:  Negative for shortness of breath, wheezing, sleep apnea, frequent cough GI:  Negative for nausea, vomiting, bloody stool, GERD  PE: BP 107/73   Pulse 96  GENERAL APPEARANCE:  Well appearing, well developed, well nourished, NAD  HEENT:  Atraumatic, normocephalic, oropharynx clear NECK:  Supple without lymphadenopathy or thyromegaly ABDOMEN:  Soft, non-tender, no masses EXTREMITIES:  Moves all extremities well, without clubbing, cyanosis, or edema NEUROLOGIC:  Alert and oriented x 3, normal gait, CN II-XII grossly intact MENTAL STATUS:  appropriate BACK:  Non-tender to palpation, No CVAT SKIN:  Warm, dry, and  intact   Results: U/A:  11-30 RBC, few bacteria

## 2022-06-01 ENCOUNTER — Ambulatory Visit (INDEPENDENT_AMBULATORY_CARE_PROVIDER_SITE_OTHER): Payer: Medicare Other | Admitting: Urology

## 2022-06-01 ENCOUNTER — Encounter: Payer: Self-pay | Admitting: Urology

## 2022-06-01 VITALS — BP 138/85 | HR 121 | Ht 62.0 in | Wt 104.0 lb

## 2022-06-01 DIAGNOSIS — R3129 Other microscopic hematuria: Secondary | ICD-10-CM

## 2022-06-01 DIAGNOSIS — N3941 Urge incontinence: Secondary | ICD-10-CM

## 2022-06-01 DIAGNOSIS — E278 Other specified disorders of adrenal gland: Secondary | ICD-10-CM

## 2022-06-01 LAB — URINALYSIS, ROUTINE W REFLEX MICROSCOPIC
Bilirubin, UA: NEGATIVE
Glucose, UA: NEGATIVE
Leukocytes,UA: NEGATIVE
Nitrite, UA: NEGATIVE
Specific Gravity, UA: 1.025 (ref 1.005–1.030)
Urobilinogen, Ur: 1 mg/dL (ref 0.2–1.0)
pH, UA: 5.5 (ref 5.0–7.5)

## 2022-06-01 LAB — MICROSCOPIC EXAMINATION: Renal Epithel, UA: NONE SEEN /hpf

## 2022-06-01 NOTE — Progress Notes (Signed)
Assessment: 1. Microscopic hematuria; negative evaluation 12/22   2. Urge incontinence of urine   3. Left adrenal mass (HCC); non-functioning adenoma    Plan: Continue trospium 20 mg BID  Continue Miralax for constipation Hematuria profile in next 1-2 weeks Patient advised to call if constipation does not improve Return to office in 2 months  Chief Complaint: Chief Complaint  Patient presents with   Hematuria    HPI: Laura Suarez is a 56 y.o. female who presents for continued evaluation of microscopic hematuria and urinary incontinence.  She reports a history of bedwetting as a child.  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  At her visit in 9/22, she reported significant urgency and urge incontinence.  She denied any frequency.  She reported nocturia 1-2 times.  No dysuria or gross hematuria.  No recent UTIs.  She was not using pads and was not having any stress urinary incontinence.  She had been on oxybutynin 5 mg 3 times daily for approximately 1 month without any significant change in her incontinence.  She reported a dry mouth.  No problems with constipation or fecal incontinence.  No neurologic defects. She was given a trial of Myrbetriq 50 mg daily 9/22. She noted some slight improvement in her urgency but continued to have urge incontinence.  No side effects from the medication. She was changed to Vesicare 10 mg daily at her visit in 10/22.  Her urinalysis on 07/08/2021 showed 3-10 RBCs. No dysuria or gross hematuria. She does have a history of tobacco use smoking 2 packs/day for approximately 40 years. Repeat urinalysis from 08/10/2021 again showed 3-10 RBCs. CT hematuria protocol from 09/15/2021 shows no renal, ureteral, or bladder calculi, no solid enhancing renal masses, tiny bilateral hypodense renal lesions statistically likely to reflect cysts, no evidence of obstruction, and a enhancing hypodense 2.2 cm left adrenal nodule consistent with adrenal  adenoma. Cystoscopy from 12/22 demonstrated a normal urethra and bladder.  She was evaluated for the left adrenal nodule with laboratory studies.  Her urine cortisol and plasma metanephrines were normal.  At her visit in January 2023, she continued on Vesicare with improvement in her urgency and urge incontinence.  No side effects.  No dysuria or gross hematuria.  At her visit in July 2023, she reported no hematuria or dysuria.  No flank pain.  She continued on Vesicare 10 mg daily.  She did not feel like the Vesicare was controlling her urgency and urge incontinence as well as it did initially.  No side effects from the medication.  She was given a trial of trospium 20 mg twice daily.  She returns today for follow-up.  She reports improvement in her symptoms with the trospium 20 mg twice daily.  She has had decreased frequency, decreased urgency, and decreased urge incontinence.  She has noted some constipation since starting the medication.  She started taking MiraLAX yesterday.  She feels like she is emptying her bladder well.  No dysuria or gross hematuria.  Portions of the above documentation were copied from a prior visit for review purposes only.  Allergies: Allergies  Allergen Reactions   Sulfa Antibiotics Rash   Ziprasidone Hcl Other (See Comments)    Unable to move limbs Unable to move limbs     PMH: Past Medical History:  Diagnosis Date   Depression    Diabetes mellitus without complication (HCC)    High cholesterol     PSH: Past Surgical History:  Procedure Laterality Date  ABDOMINAL HYSTERECTOMY      SH: Social History   Tobacco Use   Smoking status: Never   Smokeless tobacco: Never    ROS: Constitutional:  Negative for fever, chills, weight loss CV: Negative for chest pain, previous MI, hypertension Respiratory:  Negative for shortness of breath, wheezing, sleep apnea, frequent cough GI:  Negative for nausea, vomiting, bloody stool, GERD  PE: BP  138/85   Pulse (!) 121   Ht 5\' 2"  (1.575 m)   Wt 104 lb (47.2 kg)   BMI 19.02 kg/m  GENERAL APPEARANCE:  Well appearing, well developed, well nourished, NAD HEENT:  Atraumatic, normocephalic, oropharynx clear NECK:  Supple without lymphadenopathy or thyromegaly ABDOMEN:  Soft, non-tender, no masses EXTREMITIES:  Moves all extremities well, without clubbing, cyanosis, or edema NEUROLOGIC:  Alert and oriented x 3, normal gait, CN II-XII grossly intact MENTAL STATUS:  appropriate BACK:  Non-tender to palpation, No CVAT SKIN:  Warm, dry, and intact   Results: U/A:  3-10 RBC, 0-5 WBC, few bacteria, 1+ protein

## 2022-06-17 ENCOUNTER — Ambulatory Visit (INDEPENDENT_AMBULATORY_CARE_PROVIDER_SITE_OTHER): Payer: Medicare Other | Admitting: Physician Assistant

## 2022-06-17 DIAGNOSIS — R3129 Other microscopic hematuria: Secondary | ICD-10-CM

## 2022-06-17 NOTE — Progress Notes (Unsigned)
Hematuria profile sent today, req# [id number]

## 2022-06-23 ENCOUNTER — Encounter: Payer: Self-pay | Admitting: Urology

## 2022-06-24 ENCOUNTER — Telehealth: Payer: Self-pay | Admitting: Urology

## 2022-06-24 DIAGNOSIS — R3129 Other microscopic hematuria: Secondary | ICD-10-CM

## 2022-06-24 NOTE — Telephone Encounter (Signed)
Hematuria profile from 06/17/2022 showed atypical urothelial cells of uncertain diagnostic significance, possibly representing polyoma virus versus a dysplastic process, and dysmorphic erythrocytes with hemoglobin and cast indicating glomerular and a/or renal tubular bleeding.  I discussed these findings with the patient by phone.  Given the evidence of possible glomerular and/or renal tubular bleeding I recommended nephrology evaluation.  She is in agreement with this plan.  Referral placed. Keep follow-up for 2 months as scheduled.

## 2022-07-27 ENCOUNTER — Ambulatory Visit (HOSPITAL_COMMUNITY): Payer: Medicare Other

## 2022-08-03 ENCOUNTER — Ambulatory Visit (INDEPENDENT_AMBULATORY_CARE_PROVIDER_SITE_OTHER): Payer: Medicare Other | Admitting: Urology

## 2022-08-03 ENCOUNTER — Encounter: Payer: Self-pay | Admitting: Urology

## 2022-08-03 VITALS — BP 113/75 | HR 106

## 2022-08-03 DIAGNOSIS — R3129 Other microscopic hematuria: Secondary | ICD-10-CM

## 2022-08-03 DIAGNOSIS — N3941 Urge incontinence: Secondary | ICD-10-CM

## 2022-08-03 DIAGNOSIS — E278 Other specified disorders of adrenal gland: Secondary | ICD-10-CM

## 2022-08-03 MED ORDER — GEMTESA 75 MG PO TABS
75.0000 mg | ORAL_TABLET | Freq: Every day | ORAL | 0 refills | Status: DC
Start: 2022-08-03 — End: 2022-08-31

## 2022-08-03 NOTE — Progress Notes (Signed)
Assessment: 1. Microscopic hematuria; negative evaluation 12/22   2. Urge incontinence of urine   3. Left adrenal mass ([place]); non-functioning adenoma     Plan: D/C trospium due to lack of efficacy Trial of Gemtesa 75 mg daily.  Samples given. Advised patient to call with results of medication in 1 month Return to office in 3 months  Chief Complaint: Chief Complaint  Patient presents with   Hematuria    HPI: Laura Suarez is a 56 y.o. female who presents for continued evaluation of microscopic hematuria and urinary incontinence.  She reports a history of bedwetting as a child.  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  At her visit in 9/22, she reported significant urgency and urge incontinence.  She denied any frequency.  She reported nocturia 1-2 times.  No dysuria or gross hematuria.  No recent UTIs.  She was not using pads and was not having any stress urinary incontinence.  She had been on oxybutynin 5 mg 3 times daily for approximately 1 month without any significant change in her incontinence.  She reported a dry mouth.  No problems with constipation or fecal incontinence.  No neurologic defects. She was given a trial of Myrbetriq 50 mg daily 9/22. She noted some slight improvement in her urgency but continued to have urge incontinence.  No side effects from the medication. She was changed to Vesicare 10 mg daily at her visit in 10/22.  Her urinalysis on 07/08/2021 showed 3-10 RBCs. No dysuria or gross hematuria. She does have a history of tobacco use smoking 2 packs/day for approximately 40 years. Repeat urinalysis from 08/10/2021 again showed 3-10 RBCs. CT hematuria protocol from 09/15/2021 shows no renal, ureteral, or bladder calculi, no solid enhancing renal masses, tiny bilateral hypodense renal lesions statistically likely to reflect cysts, no evidence of obstruction, and a enhancing hypodense 2.2 cm left adrenal nodule consistent with adrenal  adenoma. Cystoscopy from 12/22 demonstrated a normal urethra and bladder.  She was evaluated for the left adrenal nodule with laboratory studies.  Her urine cortisol and plasma metanephrines were normal.  At her visit in January 2023, she continued on Vesicare with improvement in her urgency and urge incontinence.  No side effects.  No dysuria or gross hematuria.  At her visit in July 2023, she reported no hematuria or dysuria.  No flank pain.  She continued on Vesicare 10 mg daily.  She did not feel like the Vesicare was controlling her urgency and urge incontinence as well as it did initially.  No side effects from the medication.  She was given a trial of trospium 20 mg twice daily.  At her visit in August 2023, she reported improvement in her symptoms with the trospium 20 mg twice daily.  She had decreased frequency, decreased urgency, and decreased urge incontinence.  She had noted some constipation since starting the medication.  She started taking MiraLAX.  She felt like she was emptying her bladder well.  No dysuria or gross hematuria. Hematuria profile from 06/17/2022 showed atypical urothelial cells of uncertain diagnostic significance, possibly representing polyoma virus versus a dysplastic process, and dysmorphic erythrocytes with hemoglobin and cast indicating glomerular and/or renal tubular bleeding.  She was referred to Nephrology.  She returns today for follow-up.  No dysuria or gross hematuria.  She is scheduled to see nephrology in December 2023.  She continues on trospium.  She does not feel like this is controlling her symptoms as well as it did initially.  She  continues with urgency and occasional urge incontinence.  Portions of the above documentation were copied from a prior visit for review purposes only.  Allergies: Allergies  Allergen Reactions   Sulfa Antibiotics Rash   Ziprasidone Hcl Other (See Comments)    Unable to move limbs Unable to move limbs     PMH: Past  Medical History:  Diagnosis Date   Depression    Diabetes mellitus without complication (HCC)    High cholesterol     PSH: Past Surgical History:  Procedure Laterality Date   ABDOMINAL HYSTERECTOMY      SH: Social History   Tobacco Use   Smoking status: Never   Smokeless tobacco: Never    ROS: Constitutional:  Negative for fever, chills, weight loss CV: Negative for chest pain, previous MI, hypertension Respiratory:  Negative for shortness of breath, wheezing, sleep apnea, frequent cough GI:  Negative for nausea, vomiting, bloody stool, GERD  PE: BP 113/75   Pulse (!) 106  GENERAL APPEARANCE:  Well appearing, well developed, well nourished, NAD HEENT:  Atraumatic, normocephalic, oropharynx clear NECK:  Supple without lymphadenopathy or thyromegaly ABDOMEN:  Soft, non-tender, no masses EXTREMITIES:  Moves all extremities well, without clubbing, cyanosis, or edema NEUROLOGIC:  Alert and oriented x 3, normal gait, CN II-XII grossly intact MENTAL STATUS:  appropriate BACK:  Non-tender to palpation, No CVAT SKIN:  Warm, dry, and intact  Results: U/A:  0-5 WBC, 3-10 RBC, calcium oxalate crystals

## 2022-08-04 LAB — MICROSCOPIC EXAMINATION

## 2022-08-04 LAB — URINALYSIS, ROUTINE W REFLEX MICROSCOPIC
Bilirubin, UA: NEGATIVE
Glucose, UA: NEGATIVE
Leukocytes,UA: NEGATIVE
Nitrite, UA: NEGATIVE
Specific Gravity, UA: >1.03 — ABNORMAL HIGH (ref 1.005–1.030)
Urobilinogen, Ur: 1 mg/dL (ref 0.2–1.0)
pH, UA: 5 (ref 5.0–7.5)

## 2022-08-30 ENCOUNTER — Telehealth: Payer: Self-pay

## 2022-08-30 NOTE — Telephone Encounter (Signed)
Patient called advising she did not notice a difference in her symptoms taking Gemtesa 75mg . She wanted to know if there was another medication that could be called into her pharmacy.     Pharmacy: [id number] - [place], [place] - [address]    Thank you

## 2022-08-31 ENCOUNTER — Other Ambulatory Visit: Payer: Self-pay | Admitting: Urology

## 2022-08-31 DIAGNOSIS — N3941 Urge incontinence: Secondary | ICD-10-CM

## 2022-08-31 MED ORDER — FESOTERODINE FUMARATE ER 8 MG PO TB24: 8.0000 mg | ORAL_TABLET | Freq: Every day | ORAL | 11 refills | Status: DC

## 2022-08-31 NOTE — Telephone Encounter (Signed)
Patient returned call to office, notified patient of new medication= voiced understanding.

## 2022-09-03 ENCOUNTER — Ambulatory Visit (HOSPITAL_COMMUNITY)
Admission: RE | Admit: 2022-09-03 | Discharge: 2022-09-03 | Disposition: A | Discharge status: Home / Self Care | Payer: Medicare Other | Source: Ambulatory Visit | Attending: Pulmonary Disease | Admitting: Pulmonary Disease

## 2022-09-03 DIAGNOSIS — R911 Solitary pulmonary nodule: Secondary | ICD-10-CM | POA: Insufficient documentation

## 2022-09-06 ENCOUNTER — Other Ambulatory Visit: Payer: Self-pay

## 2022-09-06 DIAGNOSIS — R911 Solitary pulmonary nodule: Secondary | ICD-10-CM

## 2022-09-06 NOTE — Progress Notes (Signed)
Called and spoke with patient.  Gave all information.  Put in order for repeat CT scan and f/u visit after scan.  Placed recall request for these appointments.  Pt verbalized understanding and had no further questions.

## 2022-09-06 NOTE — Progress Notes (Signed)
CT with stable nodule. Recommend repeat CT scan in 1 year then if stable no further imaging required.   Can you order and place recall for f/u visit after scan?

## 2022-09-30 ENCOUNTER — Other Ambulatory Visit: Payer: Self-pay | Admitting: Internal Medicine

## 2022-09-30 DIAGNOSIS — Z1231 Encounter for screening mammogram for malignant neoplasm of breast: Secondary | ICD-10-CM

## 2022-10-19 ENCOUNTER — Ambulatory Visit
Admission: RE | Admit: 2022-10-19 | Discharge: 2022-10-19 | Disposition: A | Discharge status: Home / Self Care | Payer: Medicare Other | Source: Ambulatory Visit | Attending: Internal Medicine | Admitting: Internal Medicine

## 2022-10-19 DIAGNOSIS — Z1231 Encounter for screening mammogram for malignant neoplasm of breast: Secondary | ICD-10-CM

## 2022-10-21 ENCOUNTER — Other Ambulatory Visit: Payer: Self-pay | Admitting: Internal Medicine

## 2022-10-21 DIAGNOSIS — R928 Other abnormal and inconclusive findings on diagnostic imaging of breast: Secondary | ICD-10-CM

## 2022-10-25 DIAGNOSIS — E119 Type 2 diabetes mellitus without complications: Secondary | ICD-10-CM | POA: Diagnosis not present

## 2022-10-25 DIAGNOSIS — I10 Essential (primary) hypertension: Secondary | ICD-10-CM | POA: Diagnosis not present

## 2022-10-25 DIAGNOSIS — R3129 Other microscopic hematuria: Secondary | ICD-10-CM | POA: Diagnosis not present

## 2022-10-25 DIAGNOSIS — N189 Chronic kidney disease, unspecified: Secondary | ICD-10-CM | POA: Diagnosis not present

## 2022-11-03 ENCOUNTER — Ambulatory Visit: Payer: 59 | Admitting: Urology

## 2022-11-03 DIAGNOSIS — N3941 Urge incontinence: Secondary | ICD-10-CM

## 2022-11-04 ENCOUNTER — Ambulatory Visit
Admission: RE | Admit: 2022-11-04 | Discharge: 2022-11-04 | Disposition: A | Discharge status: Home / Self Care | Payer: 59 | Source: Ambulatory Visit | Attending: Internal Medicine | Admitting: Internal Medicine

## 2022-11-04 ENCOUNTER — Ambulatory Visit: Payer: Medicare Other

## 2022-11-04 DIAGNOSIS — Z299 Encounter for prophylactic measures, unspecified: Secondary | ICD-10-CM | POA: Diagnosis not present

## 2022-11-04 DIAGNOSIS — H109 Unspecified conjunctivitis: Secondary | ICD-10-CM | POA: Diagnosis not present

## 2022-11-04 DIAGNOSIS — J069 Acute upper respiratory infection, unspecified: Secondary | ICD-10-CM | POA: Diagnosis not present

## 2022-11-04 DIAGNOSIS — R922 Inconclusive mammogram: Secondary | ICD-10-CM | POA: Diagnosis not present

## 2022-11-04 DIAGNOSIS — R928 Other abnormal and inconclusive findings on diagnostic imaging of breast: Secondary | ICD-10-CM

## 2022-11-04 DIAGNOSIS — R5383 Other fatigue: Secondary | ICD-10-CM | POA: Diagnosis not present

## 2022-11-09 DIAGNOSIS — D649 Anemia, unspecified: Secondary | ICD-10-CM | POA: Diagnosis not present

## 2022-11-10 ENCOUNTER — Ambulatory Visit: Payer: 59 | Admitting: Urology

## 2022-12-05 IMAGING — CT CT CHEST SUPER D W/O CM
2 of 4 series · 15 of 36 positions shown, 18 images · non-contrast
Comparison: CT chest dated October 21, 2021

CLINICAL DATA: Follow-up lung nodule

EXAM:
CT CHEST WITHOUT CONTRAST
TECHNIQUE: Multidetector CT imaging of the chest was performed using thin slice
collimation for electromagnetic bronchoscopy planning purposes,
without intravenous contrast.
RADIATION DOSE REDUCTION: This exam was performed according to the
departmental dose-optimization program which includes automated
exposure control, adjustment of the mA and/or kV according to
patient size and/or use of iterative reconstruction technique.

[Series 4: thorax · axial · 0.74mm/px · z∈[+1253,+1481]mm · 12 of 136 slices shown, 15 images]
[im 11/136  mediastinal]
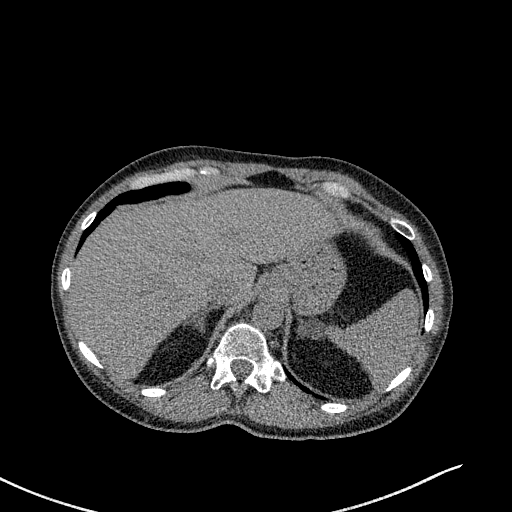
[im 11/136  lung]
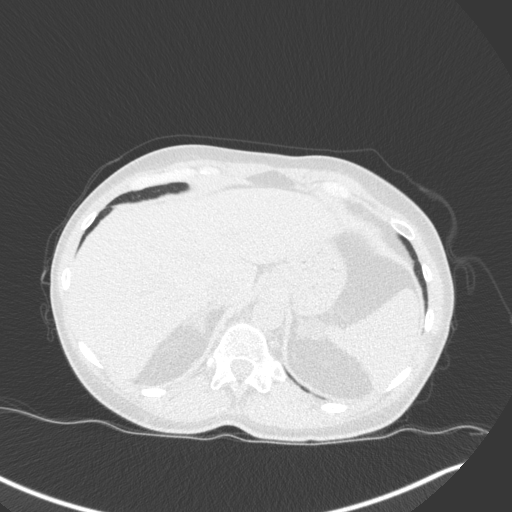
[im 21/136  lung]
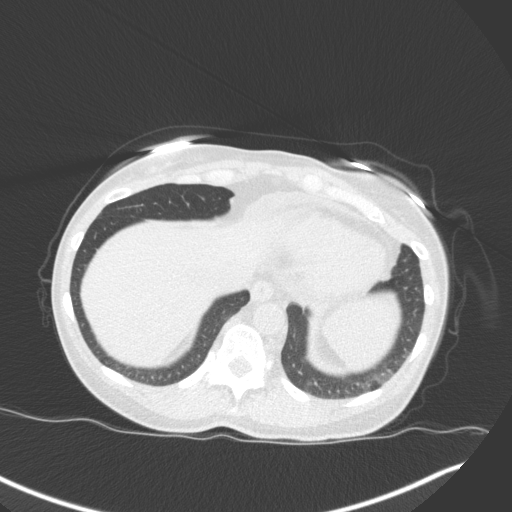
[im 32/136  lung]
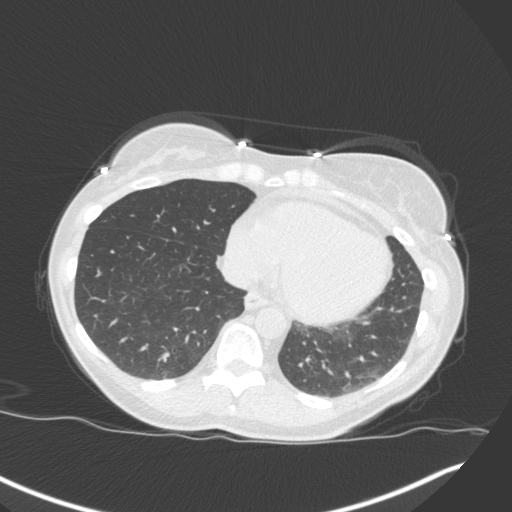
[im 42/136  lung]
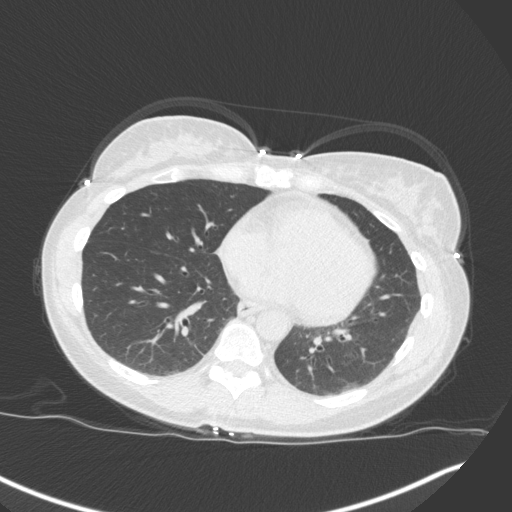
[im 52/136  mediastinal]
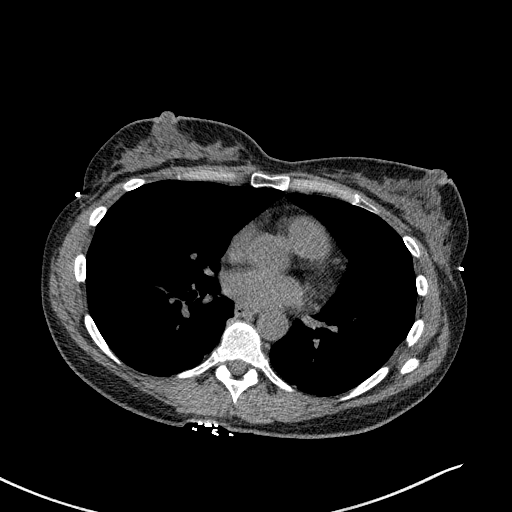
[im 52/136  lung]
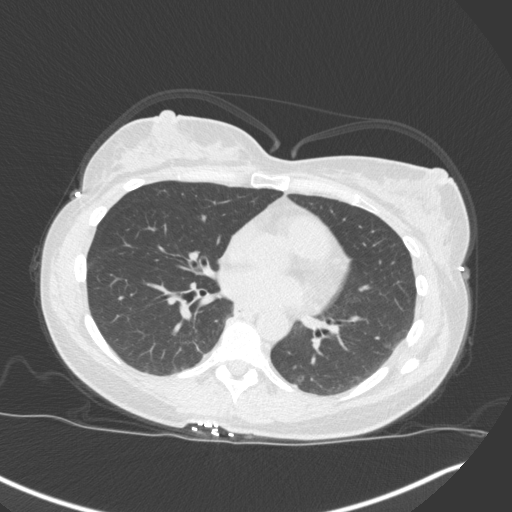
[im 63/136  lung]
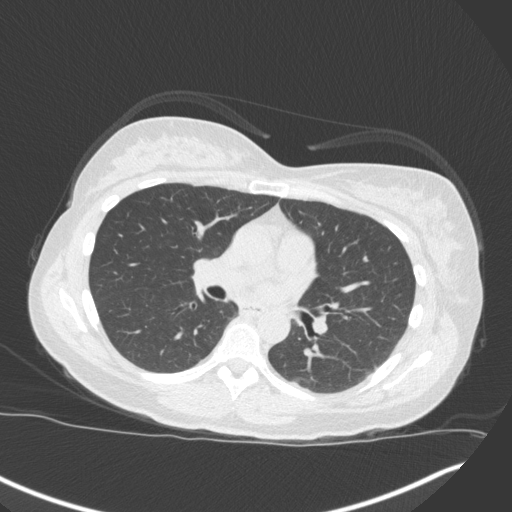
[im 73/136  lung]
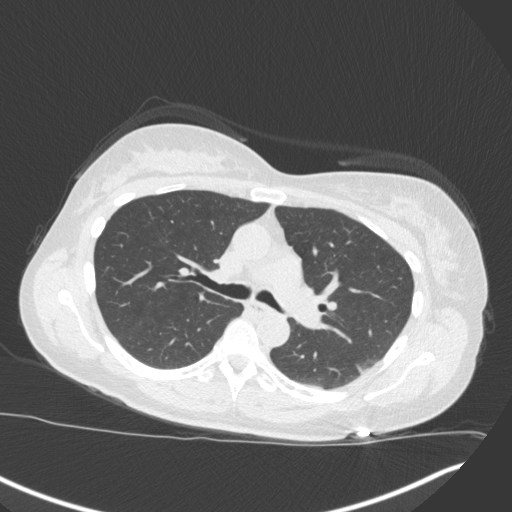
[im 84/136  lung]
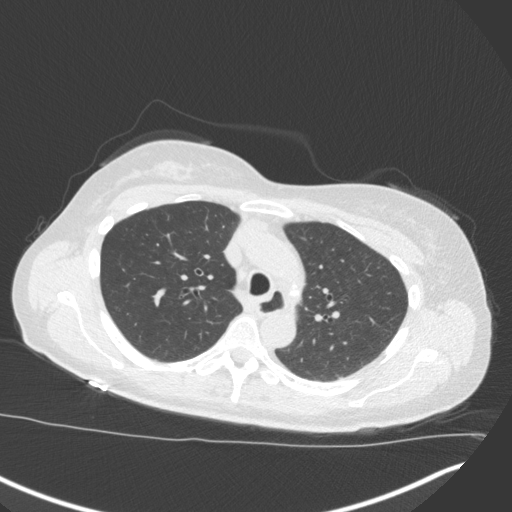
[im 94/136  mediastinal]
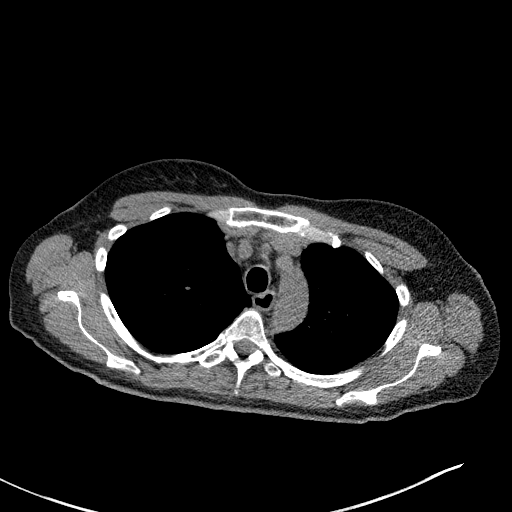
[im 94/136  lung]
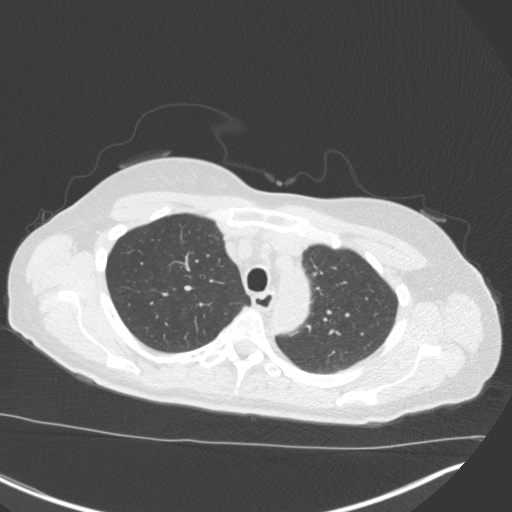
[im 104/136  lung]
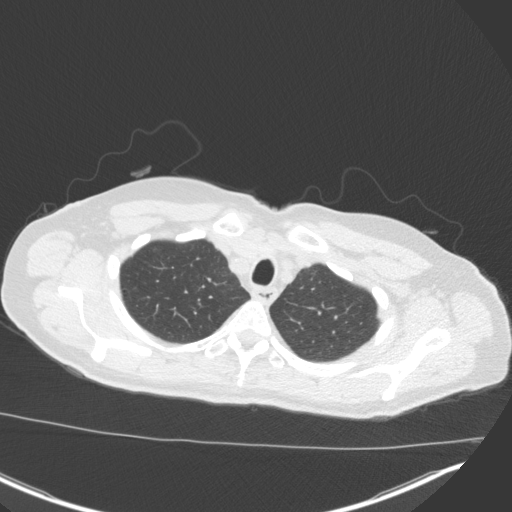
[im 115/136  lung]
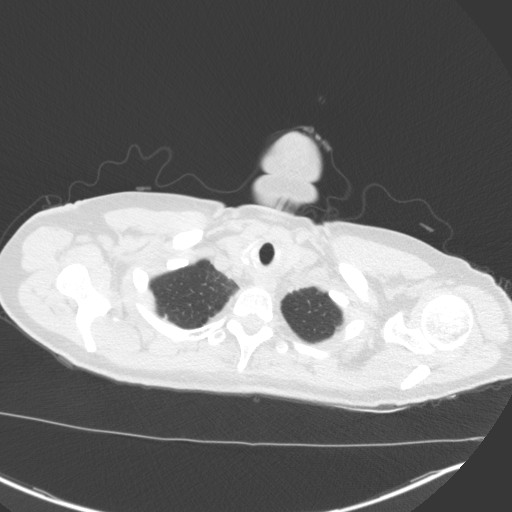
[im 125/136  lung]
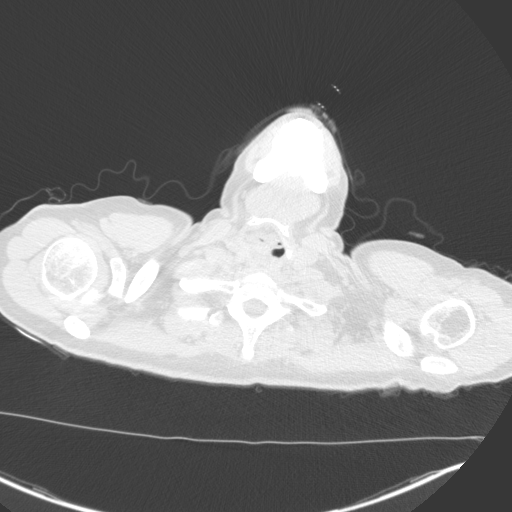

[Series 7: coronal · coronal · 0.59mm/px · 3 of 120 slices shown]
[im 24/120  lung]
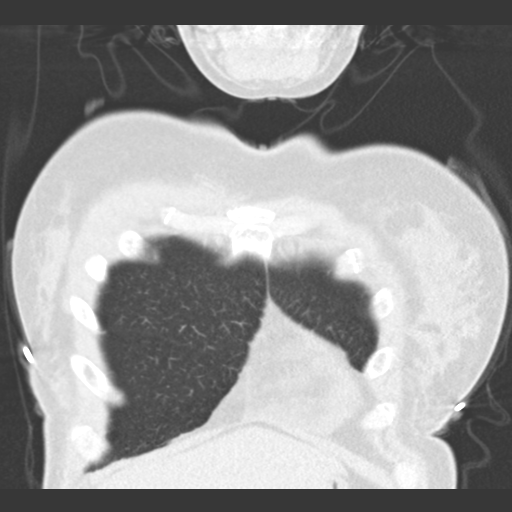
[im 48/120  lung]
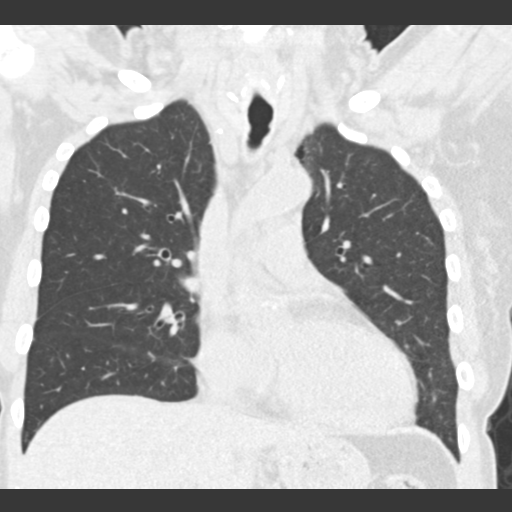
[im 72/120  lung]
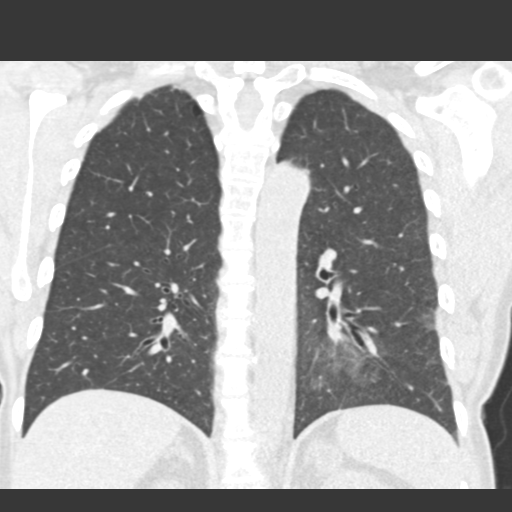

[15 of 36 positions shown; findings below may reference images not displayed]

FINDINGS: Cardiovascular: Normal heart size. Small pericardial effusion. Mild
coronary artery calcifications. Mild atherosclerotic disease of the
thoracic aorta.

Mediastinum/Nodes: Esophagus and thyroid are unremarkable. No
pathologically enlarged lymph nodes seen in the chest.

Lungs/Pleura: Central airways are patent. Mild paraseptal emphysema.
No consolidation, pleural effusion or pneumothorax. Solid pulmonary
nodule of the right lower lobe measures 7 x 5 mm on series 6, image
80, unchanged size when compared with prior exam and remeasured in
similar plane.

Upper Abdomen: Stable low-attenuation left adrenal gland nodule
measuring 2.2 cm, likely a benign adenoma with no further follow-up
imaging recommended for this finding. No acute abnormality. One

Musculoskeletal: No chest wall mass or suspicious bone lesions
identified.
IMPRESSION: 1. Stable irregular solid pulmonary nodule of the right lower lobe.
Recommend additional six-month follow-up CT.
2. Aortic Atherosclerosis (49AJW-USF.F) and Emphysema ([id number].D).

## 2022-12-10 ENCOUNTER — Encounter: Payer: Self-pay | Admitting: Urology

## 2022-12-10 ENCOUNTER — Ambulatory Visit (INDEPENDENT_AMBULATORY_CARE_PROVIDER_SITE_OTHER): Payer: 59 | Admitting: Urology

## 2022-12-10 VITALS — BP 129/81 | HR 97

## 2022-12-10 DIAGNOSIS — R3129 Other microscopic hematuria: Secondary | ICD-10-CM

## 2022-12-10 DIAGNOSIS — N3941 Urge incontinence: Secondary | ICD-10-CM

## 2022-12-10 LAB — URINALYSIS, ROUTINE W REFLEX MICROSCOPIC
Bilirubin, UA: NEGATIVE
Ketones, UA: NEGATIVE
Leukocytes,UA: NEGATIVE
Nitrite, UA: NEGATIVE
Protein,UA: NEGATIVE
Specific Gravity, UA: 1.015 (ref 1.005–1.030)
Urobilinogen, Ur: 1 mg/dL (ref 0.2–1.0)
pH, UA: 6 (ref 5.0–7.5)

## 2022-12-10 LAB — MICROSCOPIC EXAMINATION: Bacteria, UA: NONE SEEN

## 2022-12-10 NOTE — Patient Instructions (Signed)

## 2022-12-10 NOTE — Progress Notes (Signed)
12/10/2022 11:04 AM   [name] [birth year] [id number]  Referring provider: Wilburt Finlay, MD [address],  [place] [address]  Followup OAB and microhematuria   HPI: [name] is a 57yo here for followup for microhematuria and OAB. UA today shows no RBCs. She has DMII and last A1c was 8.2. She continues to have urinary frequency and urgency. She has daily urge incontinence. She has tried and failed mirabegron, gemtesa, oxybuytnin, and toviaz. No other complaints today   PMH: Past Medical History:  Diagnosis Date   Depression    Diabetes mellitus without complication ([place])    High cholesterol     Surgical History: Past Surgical History:  Procedure Laterality Date   ABDOMINAL HYSTERECTOMY      Home Medications:  Allergies as of 12/10/2022       Reactions   Sulfa Antibiotics Rash   Ziprasidone Hcl Other (See Comments)   Unable to move limbs Unable to move limbs        Medication List        Accurate as of December 10, 2022 11:04 AM. If you have any questions, ask your nurse or doctor.          aspirin EC 81 MG tablet Take by mouth.   atorvastatin 40 MG tablet Commonly known as: LIPITOR Take 40 mg by mouth at bedtime.   Biotin 1 MG Caps Take by mouth.   Coenzyme Q10 100 MG Chew Chew by mouth.   fesoterodine 8 MG Tb24 tablet Commonly known as: TOVIAZ Take 1 tablet (8 mg total) by mouth daily.   glimepiride 2 MG tablet Commonly known as: AMARYL Take by mouth.   hydrOXYzine 10 MG tablet Commonly known as: ATARAX Take 10 mg by mouth at bedtime.   metFORMIN 1000 MG tablet Commonly known as: GLUCOPHAGE Take 1,000 mg by mouth 2 (two) times daily.   omeprazole 40 MG capsule Commonly known as: PRILOSEC Take 40 mg by mouth daily.   OXcarbazepine 150 MG tablet Commonly known as: TRILEPTAL Take 300 mg by mouth at bedtime.   PARoxetine 40 MG tablet Commonly known as: PAXIL Take 40 mg by mouth daily.   risperiDONE 1 MG tablet Commonly  known as: RISPERDAL Take by mouth.   traZODone 150 MG tablet Commonly known as: DESYREL Take 150 mg by mouth at bedtime.   Tyler Aas FlexTouch 200 UNIT/ML FlexTouch Pen Generic drug: insulin degludec SMARTSIG:8 Unit(s) SUB-Q Every Night        Allergies:  Allergies  Allergen Reactions   Sulfa Antibiotics Rash   Ziprasidone Hcl Other (See Comments)    Unable to move limbs Unable to move limbs     Family History: No family history on file.  Social History:  reports that she has never smoked. She has never used smokeless tobacco. No history on file for alcohol use and drug use.  ROS: All other review of systems were reviewed and are negative except what is noted above in HPI  Physical Exam: BP 129/81   Pulse 97   Constitutional:  Alert and oriented, No acute distress. HEENT: [place] AT, moist mucus membranes.  Trachea midline, no masses. Cardiovascular: No clubbing, cyanosis, or edema. Respiratory: Normal respiratory effort, no increased work of breathing. GI: Abdomen is soft, nontender, nondistended, no abdominal masses GU: No CVA tenderness.  Lymph: No cervical or inguinal lymphadenopathy. Skin: No rashes, bruises or suspicious lesions. Neurologic: Grossly intact, no focal deficits, moving all 4 extremities. Psychiatric: Normal mood and affect.  Laboratory Data: No results found for: "WBC", "HGB", "HCT", "MCV", "PLT"  Lab Results  Component Value Date   CREATININE 0.70 09/15/2021    No results found for: "PSA"  No results found for: "TESTOSTERONE"  No results found for: "HGBA1C"  Urinalysis    Component Value Date/Time   APPEARANCEUR Clear 08/03/2022 1554   GLUCOSEU Negative 08/03/2022 1554   BILIRUBINUR Negative 08/03/2022 1554   PROTEINUR 1+ (A) 08/03/2022 1554   NITRITE Negative 08/03/2022 1554   LEUKOCYTESUR Negative 08/03/2022 1554    Lab Results  Component Value Date   LABMICR See below: 08/03/2022   WBCUA 0-5 08/03/2022   LABEPIT 0-10  08/03/2022   MUCUS Present (A) 08/03/2022   BACTERIA Few 06/01/2022    Pertinent Imaging:  No results found for this or any previous visit.  No results found for this or any previous visit.  No results found for this or any previous visit.  No results found for this or any previous visit.  No results found for this or any previous visit.  No valid procedures specified. Results for orders placed during the hospital encounter of 09/15/21  CT HEMATURIA WORKUP  Narrative CLINICAL DATA:  Microscopic hematuria with urge incontinence and adult nocturnal enuresis  EXAM: CT ABDOMEN AND PELVIS WITHOUT AND WITH CONTRAST  TECHNIQUE: Multidetector CT imaging of the abdomen and pelvis was performed following the standard protocol before and following the bolus administration of intravenous contrast.  CONTRAST:  100 cc of Omnipaque 300  COMPARISON:  Chest CT August 23, 2021  FINDINGS: Lower chest: Bibasilar atelectasis.  Hepatobiliary: No suspicious hepatic lesion. Hyperenhancing 7 mm focus in the anterior liver on image 12/6 which is isoattenuating to background parenchyma on non contrasted and delayed imaging. A 10 mm hyperenhancing focus in the posterior left hepatic lobe on image 14/7 demonstrates similar imaging characteristics. Gallbladder is unremarkable. No biliary ductal dilation.  Pancreas: No pancreatic ductal dilation or evidence of acute inflammation.  Spleen: Within normal limits.  Adrenals/Urinary Tract: Enhancing intrinsically hypodense 2.2 cm left adrenal nodule on image 18/7 is consistent with an adrenal adenoma. Right adrenal thickening without discrete nodularity, favor hyperplasia.  No hydronephrosis. No renal, ureteral or bladder calculi identified. No solid enhancing renal mass. Tiny bilateral hypodense renal lesions are technically too small to accurately characterize but statistically likely to reflect cysts.  Bilateral kidneys demonstrate  symmetric enhancement and excretion of contrast material. No collecting system duplication. No suspicious filling defect visualized within the opacified portions of the collecting systems or ureters on delayed imaging.  Mild symmetric wall thickening of the urinary bladder. Evaluation of the urinary bladder is somewhat limited by urine contamination within this context there is no asymmetric urinary bladder wall thickening or suspicious intraluminal filling defect visualized.  Stomach/Bowel: No enteric contrast was administered. Small hiatal hernia otherwise the stomach is unremarkable for degree of distension. No pathologic dilation of large or small bowel. The appendix and terminal ileum appear normal. Colonic diverticulosis without findings of acute diverticulitis.  Vascular/Lymphatic: Scattered aortic and branch vessel atherosclerosis without abdominal aortic aneurysm. No pathologically enlarged abdominal or pelvic lymph nodes identified.  Reproductive: Status post hysterectomy. No adnexal masses.  Other: No significant abdominopelvic free fluid.  Musculoskeletal: No acute or significant osseous findings.  IMPRESSION: 1. Mild symmetric wall thickening of the urinary bladder, which can be seen with cystitis. Correlation with urinalysis is recommended. 2. No hydronephrosis. No renal, ureteral, or bladder calculi identified. 3. No solid enhancing renal mass. 4. Two hyperenhancing hepatic lesions  measuring up to 10 mm, most likely reflect benign flash filling hemangiomas. 5. Benign 2.2 cm left adrenal adenoma, consider biochemical analysis to assess for lesion functionality. 6. Colonic diverticulosis without findings of acute diverticulitis. 7.  Aortic Atherosclerosis (ICD10-I70.0).   Electronically Signed By: Dahlia Bailiff M.D. On: 09/15/2021 18:23  No results found for this or any previous visit.   Assessment & Plan:    1. Urge incontinence of urine -We discussed  PTNS, intravesical botox and interstim. After discussing the options the patient elects for PTNS - Urinalysis, Routine w reflex microscopic  2. Microscopic hematuria; negative evaluation 12/22 -followup in 6 months with [place]   No follow-ups on file.  Nicolette Bang, MD  [facility] [place]

## 2022-12-23 DIAGNOSIS — Z882 Allergy status to sulfonamides status: Secondary | ICD-10-CM | POA: Diagnosis not present

## 2022-12-23 DIAGNOSIS — Z881 Allergy status to other antibiotic agents status: Secondary | ICD-10-CM | POA: Diagnosis not present

## 2022-12-23 DIAGNOSIS — K649 Unspecified hemorrhoids: Secondary | ICD-10-CM | POA: Diagnosis not present

## 2022-12-23 DIAGNOSIS — Z794 Long term (current) use of insulin: Secondary | ICD-10-CM | POA: Diagnosis not present

## 2022-12-23 DIAGNOSIS — Z539 Procedure and treatment not carried out, unspecified reason: Secondary | ICD-10-CM | POA: Diagnosis not present

## 2022-12-23 DIAGNOSIS — Z79899 Other long term (current) drug therapy: Secondary | ICD-10-CM | POA: Diagnosis not present

## 2022-12-23 DIAGNOSIS — Z87891 Personal history of nicotine dependence: Secondary | ICD-10-CM | POA: Diagnosis not present

## 2022-12-23 DIAGNOSIS — D509 Iron deficiency anemia, unspecified: Secondary | ICD-10-CM | POA: Diagnosis not present

## 2022-12-23 DIAGNOSIS — E785 Hyperlipidemia, unspecified: Secondary | ICD-10-CM | POA: Diagnosis not present

## 2022-12-23 DIAGNOSIS — Z7982 Long term (current) use of aspirin: Secondary | ICD-10-CM | POA: Diagnosis not present

## 2022-12-23 DIAGNOSIS — D649 Anemia, unspecified: Secondary | ICD-10-CM | POA: Diagnosis not present

## 2022-12-23 DIAGNOSIS — Z7984 Long term (current) use of oral hypoglycemic drugs: Secondary | ICD-10-CM | POA: Diagnosis not present

## 2022-12-23 DIAGNOSIS — Z888 Allergy status to other drugs, medicaments and biological substances status: Secondary | ICD-10-CM | POA: Diagnosis not present

## 2022-12-23 DIAGNOSIS — E119 Type 2 diabetes mellitus without complications: Secondary | ICD-10-CM | POA: Diagnosis not present

## 2022-12-23 DIAGNOSIS — K2970 Gastritis, unspecified, without bleeding: Secondary | ICD-10-CM | POA: Diagnosis not present

## 2022-12-29 DIAGNOSIS — E1129 Type 2 diabetes mellitus with other diabetic kidney complication: Secondary | ICD-10-CM | POA: Diagnosis not present

## 2022-12-29 DIAGNOSIS — R0789 Other chest pain: Secondary | ICD-10-CM | POA: Diagnosis not present

## 2022-12-30 DIAGNOSIS — R0789 Other chest pain: Secondary | ICD-10-CM | POA: Diagnosis not present

## 2022-12-30 DIAGNOSIS — R0781 Pleurodynia: Secondary | ICD-10-CM | POA: Diagnosis not present

## 2022-12-30 DIAGNOSIS — S299XXA Unspecified injury of thorax, initial encounter: Secondary | ICD-10-CM | POA: Diagnosis not present

## 2023-01-03 ENCOUNTER — Ambulatory Visit (INDEPENDENT_AMBULATORY_CARE_PROVIDER_SITE_OTHER): Payer: 59 | Admitting: Urology

## 2023-01-03 DIAGNOSIS — N3941 Urge incontinence: Secondary | ICD-10-CM

## 2023-01-03 NOTE — Progress Notes (Signed)
PTNS  Session # 1 of 12  Health & Social Factors: diabetes Caffeine: tea Alcohol: no Daytime voids #per day: 4-5 times Night-time voids #per night: 2-3 Urgency: n/a Incontinence Episodes #per day: n/a Ankle used: left Treatment Setting: 2 Feeling/ Response: tingling in foot Comments: n/a  Performed By: Levi Aland, CMA  Follow Up: Follow up as scheduled.

## 2023-01-06 DIAGNOSIS — E1129 Type 2 diabetes mellitus with other diabetic kidney complication: Secondary | ICD-10-CM | POA: Diagnosis not present

## 2023-01-06 DIAGNOSIS — Z299 Encounter for prophylactic measures, unspecified: Secondary | ICD-10-CM | POA: Diagnosis not present

## 2023-01-06 DIAGNOSIS — E1165 Type 2 diabetes mellitus with hyperglycemia: Secondary | ICD-10-CM | POA: Diagnosis not present

## 2023-01-06 DIAGNOSIS — R809 Proteinuria, unspecified: Secondary | ICD-10-CM | POA: Diagnosis not present

## 2023-01-06 DIAGNOSIS — K5909 Other constipation: Secondary | ICD-10-CM | POA: Diagnosis not present

## 2023-01-10 ENCOUNTER — Ambulatory Visit (INDEPENDENT_AMBULATORY_CARE_PROVIDER_SITE_OTHER): Payer: 59 | Admitting: Urology

## 2023-01-10 DIAGNOSIS — N3941 Urge incontinence: Secondary | ICD-10-CM

## 2023-01-10 NOTE — Progress Notes (Addendum)
PTNS  Session # 2 of 12  Health & Social Factors: diabetes Caffeine: tea all day Alcohol: none Daytime voids #per day: 5-6 Night-time voids #per night: 2-3 Urgency: yes Incontinence Episodes #per day: yes Ankle used: right Treatment Setting: 2 Feeling/ Response: positive sensation Comments: n/a  Performed By: Myelle Poteat LPN/ assisted by Levi Aland CMA  Follow Up: keep scheduled  [place]

## 2023-01-17 ENCOUNTER — Ambulatory Visit: Payer: 59

## 2023-01-17 DIAGNOSIS — R3129 Other microscopic hematuria: Secondary | ICD-10-CM | POA: Diagnosis not present

## 2023-01-17 DIAGNOSIS — E119 Type 2 diabetes mellitus without complications: Secondary | ICD-10-CM | POA: Diagnosis not present

## 2023-01-17 DIAGNOSIS — I10 Essential (primary) hypertension: Secondary | ICD-10-CM | POA: Diagnosis not present

## 2023-01-18 ENCOUNTER — Ambulatory Visit (INDEPENDENT_AMBULATORY_CARE_PROVIDER_SITE_OTHER): Payer: 59 | Admitting: Urology

## 2023-01-18 DIAGNOSIS — N3941 Urge incontinence: Secondary | ICD-10-CM

## 2023-01-18 NOTE — Progress Notes (Signed)
PTNS  Session # 3 of 12  Health & Social Factors: diabetes Caffeine: tea everyday  Alcohol: no Daytime voids #per day: 6-7 Night-time voids #per night: 2-3 Urgency: yes Incontinence Episodes #per day: yes Ankle used: left Treatment Setting: 4 Feeling/ Response: tingling in foot Comments: n/a  Performed By: Guss Bunde, CMA  Follow Up: Follow up as scheduled.

## 2023-01-24 ENCOUNTER — Ambulatory Visit (INDEPENDENT_AMBULATORY_CARE_PROVIDER_SITE_OTHER): Payer: 59 | Admitting: Urology

## 2023-01-24 DIAGNOSIS — N3941 Urge incontinence: Secondary | ICD-10-CM

## 2023-01-24 NOTE — Progress Notes (Addendum)
PTNS  Session # 4 of 12  Health & Social Factors: diabetic Caffeine: tea all day Alcohol: none Daytime voids #per day: 5-6 Night-time voids #per night: 2-3 Urgency: yes Incontinence Episodes #per day: yes Ankle used: right Treatment Setting: 2 Feeling/ Response: positive senstion Comments: n/a  Performed By: [facility] LPN  Follow Up: keep scheduled NV

## 2023-01-31 ENCOUNTER — Ambulatory Visit (INDEPENDENT_AMBULATORY_CARE_PROVIDER_SITE_OTHER): Payer: 59 | Admitting: Urology

## 2023-01-31 DIAGNOSIS — N3941 Urge incontinence: Secondary | ICD-10-CM

## 2023-01-31 NOTE — Progress Notes (Signed)
PTNS  Session # 5 of 12  Health & Social Factors: yes Caffeine: tea all day Alcohol: no Daytime voids #per day: 6-7 Night-time voids #per night: 2-3 Urgency: yes Incontinence Episodes #per day: yes Ankle used: left Treatment Setting: 6 Feeling/ Response: positive sensation Comments: n/a  Performed By: Kennyth Lose, CMA  Follow Up: Keep nurse visit

## 2023-02-08 ENCOUNTER — Ambulatory Visit (INDEPENDENT_AMBULATORY_CARE_PROVIDER_SITE_OTHER): Payer: 59

## 2023-02-08 DIAGNOSIS — N3941 Urge incontinence: Secondary | ICD-10-CM | POA: Diagnosis not present

## 2023-02-08 NOTE — Progress Notes (Addendum)
PTNS  Session # 6 of 12  Health & Social Factors: diabetes Caffeine: tea all day Alcohol: mo Daytime voids #per day: 5-6 Night-time voids #per night: 2-3 Urgency: yes Incontinence Episodes #per day: yes Ankle used: right Treatment Setting: 1 Feeling/ Response: tingling in foot Comments: n/a  Performed By: Bridgette Habermann, LPN  Follow Up: Follow up as scheduled.

## 2023-02-17 ENCOUNTER — Ambulatory Visit: Payer: 59

## 2023-02-24 ENCOUNTER — Ambulatory Visit (INDEPENDENT_AMBULATORY_CARE_PROVIDER_SITE_OTHER): Payer: 59

## 2023-02-24 DIAGNOSIS — N3941 Urge incontinence: Secondary | ICD-10-CM | POA: Diagnosis not present

## 2023-02-24 NOTE — Progress Notes (Signed)
PTNS  Session # 7 of 12  Health & Social Factors: Diabetes Caffeine: tea all day long Alcohol: no Daytime voids #per day: 5-6 Night-time voids #per night: 2-3 Urgency: yes Incontinence Episodes #per day: yes Ankle used: left  Limiting supplies in office and unable to stick patient after 3 attempts, patient agree to come back next week for PTNS treatment.  Performed By: Kennyth Lose, CMA

## 2023-03-03 ENCOUNTER — Ambulatory Visit (INDEPENDENT_AMBULATORY_CARE_PROVIDER_SITE_OTHER): Payer: 59

## 2023-03-03 DIAGNOSIS — N3941 Urge incontinence: Secondary | ICD-10-CM

## 2023-03-03 NOTE — Progress Notes (Addendum)
PTNS  Session # 7 of 12  Health & Social Factors: yes diabetes Caffeine: tea all day Alcohol: no Daytime voids #per day: 5-6 Night-time voids #per night: 2-3 Urgency: yes Incontinence Episodes #per day: yes Ankle used: right Treatment Setting: 6 Feeling/ Response: positive response Comments: N/A  Performed By: Kennyth Lose, CMA/Kourtney, CMA  Follow Up: Keep nurse visit

## 2023-03-10 ENCOUNTER — Ambulatory Visit (INDEPENDENT_AMBULATORY_CARE_PROVIDER_SITE_OTHER): Payer: 59

## 2023-03-10 DIAGNOSIS — N3941 Urge incontinence: Secondary | ICD-10-CM

## 2023-03-10 NOTE — Progress Notes (Addendum)
PTNS  Session # 8 of 12  Health & Social Factors: diabetes  Caffeine:  tea all day Alcohol: no Daytime voids #per day: 5-6 Night-time voids #per night: 3-4 Urgency: yes Incontinence Episodes #per day: yes Ankle used: Right Treatment Setting: 6 Feeling/ Response: positive sensation   Comments: N/A  Performed By: Kennyth Lose, CMA  Follow Up: Keep scheduled Nurse Visit

## 2023-03-22 DIAGNOSIS — D649 Anemia, unspecified: Secondary | ICD-10-CM | POA: Diagnosis not present

## 2023-03-22 DIAGNOSIS — Z299 Encounter for prophylactic measures, unspecified: Secondary | ICD-10-CM | POA: Diagnosis not present

## 2023-03-22 DIAGNOSIS — E1165 Type 2 diabetes mellitus with hyperglycemia: Secondary | ICD-10-CM | POA: Diagnosis not present

## 2023-03-23 ENCOUNTER — Encounter (INDEPENDENT_AMBULATORY_CARE_PROVIDER_SITE_OTHER): Payer: Self-pay | Admitting: *Deleted

## 2023-03-24 ENCOUNTER — Ambulatory Visit (INDEPENDENT_AMBULATORY_CARE_PROVIDER_SITE_OTHER): Payer: 59

## 2023-03-24 DIAGNOSIS — N3941 Urge incontinence: Secondary | ICD-10-CM

## 2023-03-24 NOTE — Progress Notes (Signed)
PTNS  Session # 10 of 12  Health & Social Factors: diabetes Caffeine: tea all day Alcohol: no Daytime voids #per day: 5-6 Night-time voids #per night: 2-3 Urgency: yes Incontinence Episodes #per day: yes Ankle used: left Treatment Setting: 5 Feeling/ Response: positive response Comments: N/A  Performed By: Kennyth Lose, CMA  Follow Up: keep nurse visit

## 2023-03-31 ENCOUNTER — Ambulatory Visit (INDEPENDENT_AMBULATORY_CARE_PROVIDER_SITE_OTHER): Payer: 59

## 2023-03-31 DIAGNOSIS — N3941 Urge incontinence: Secondary | ICD-10-CM | POA: Diagnosis not present

## 2023-03-31 NOTE — Progress Notes (Signed)
PTNS  Session # 11 of 12  Health & Social Factors: Diabetes Caffeine: tea all day Alcohol: no Daytime voids #per day: 5-6 Night-time voids #per night: 2-3 Urgency: yes Incontinence Episodes #per day: yes Ankle used: right Treatment Setting: 6 Feeling/ Response: positive response Comments: N/A  Performed By: Kennyth Lose, CMA  Follow Up: keep nurse visit

## 2023-04-11 ENCOUNTER — Ambulatory Visit: Payer: 59

## 2023-04-11 ENCOUNTER — Telehealth (INDEPENDENT_AMBULATORY_CARE_PROVIDER_SITE_OTHER): Payer: Self-pay | Admitting: Gastroenterology

## 2023-04-11 NOTE — Telephone Encounter (Signed)
Please advise which room. Thank you! 

## 2023-04-11 NOTE — Telephone Encounter (Signed)
Who is your primary care physician: Sandrea Matte  Reasons for the colonoscopy: hx polyps   Have you had a colonoscopy before?  Yes 2021  Do you have family history of colon cancer? no  Previous colonoscopy with polyps removed? Yes 2018  Do you have a history colorectal cancer?   no  Are you diabetic? If yes, Type 1 or Type 2?    Yes Type 2  Do you have a prosthetic or mechanical heart valve? no  Do you have a pacemaker/defibrillator?   no  Have you had endocarditis/atrial fibrillation? no  Have you had joint replacement within the last 12 months?  no  Do you tend to be constipated or have to use laxatives? no  Do you have any history of drugs or alchohol?  no  Do you use supplemental oxygen?  no  Have you had a stroke or heart attack within the last 6 months? no  Do you take weight loss medication?  no  For female patients: have you had a hysterectomy?  yes                                     are you post menopausal?                                                   do you still have your menstrual cycle? no      Do you take any blood-thinning medications such as: (aspirin, warfarin, Plavix, Aggrenox)    If yes we need the name, milligram, dosage and who is prescribing doctor Aspirin 85 mg 1 a day Current Outpatient Medications on File Prior to Visit  Medication Sig Dispense Refill   aspirin 81 MG EC tablet Take by mouth.     atorvastatin (LIPITOR) 40 MG tablet Take 40 mg by mouth at bedtime.     Biotin 1 MG CAPS Take by mouth.     busPIRone (BUSPAR) 10 MG tablet Take 10 mg by mouth 3 (three) times daily.     Coenzyme Q10 100 MG CHEW Chew by mouth.     FARXIGA 5 MG TABS tablet Take 5 mg by mouth daily.     ferrous sulfate 324 MG TBEC Take 324 mg by mouth 3 (three) times a week.     fesoterodine (TOVIAZ) 8 MG TB24 tablet Take 1 tablet (8 mg total) by mouth daily. 30 tablet 11   hydrOXYzine (ATARAX/VISTARIL) 10 MG tablet Take 10 mg by mouth at bedtime.      Lactobacillus (PROBIOTIC ACIDOPHILUS PO) Take by mouth.     lisinopril (ZESTRIL) 2.5 MG tablet Take 2.5 mg by mouth daily.     metFORMIN (GLUCOPHAGE) 1000 MG tablet Take 1,000 mg by mouth 2 (two) times daily.     omeprazole (PRILOSEC) 40 MG capsule Take 40 mg by mouth daily.     OXcarbazepine (TRILEPTAL) 150 MG tablet Take 300 mg by mouth at bedtime.     PARoxetine (PAXIL) 40 MG tablet Take 40 mg by mouth daily.     risperiDONE (RISPERDAL) 1 MG tablet Take by mouth.     traZODone (DESYREL) 150 MG tablet Take 150 mg by mouth at bedtime.     TRESIBA FLEXTOUCH 200 UNIT/ML FlexTouch Pen SMARTSIG:8 Unit(s) SUB-Q Every  Night     No current facility-administered medications on file prior to visit.    Allergies  Allergen Reactions   Sulfa Antibiotics Rash   Ziprasidone Hcl Other (See Comments)    Unable to move limbs Unable to move limbs      Pharmacy: [facility]  Primary Insurance Name: [employer] Dual Complete  Best number where you can be reached: [phone]

## 2023-04-13 ENCOUNTER — Encounter (INDEPENDENT_AMBULATORY_CARE_PROVIDER_SITE_OTHER): Payer: Self-pay | Admitting: *Deleted

## 2023-04-13 MED ORDER — PEG 3350-KCL-NA BICARB-NACL 420 G PO SOLR: 4000.0000 mL | Freq: Once | ORAL | 0 refills | Status: AC

## 2023-04-13 NOTE — Addendum Note (Signed)
Addended by: Marlowe Shores on: 04/13/2023 11:45 AM   Modules accepted: Orders

## 2023-04-13 NOTE — Telephone Encounter (Signed)
Referral completed

## 2023-04-13 NOTE — Telephone Encounter (Signed)
Pt contacted and colonoscopy scheduled for 04/29/23 at 7:30am. Instructions mailed to patient. Prep sent to pharmacy   Notification or Prior Authorization is not required for the requested services You are not required to submit a notification/prior authorization based on the information provided. If you have general questions about the prior authorization requirements, visit [email or web address] > Clinician Resources > Advance and Admission Notification Requirements. The number above acknowledges your notification. Please write this reference number down for future reference. If you would like to request an organization determination, please call us at [phone]. Decision ID #: [id number] The number above acknowledges

## 2023-04-20 ENCOUNTER — Ambulatory Visit: Payer: 59

## 2023-04-22 DIAGNOSIS — Z299 Encounter for prophylactic measures, unspecified: Secondary | ICD-10-CM | POA: Diagnosis not present

## 2023-04-22 DIAGNOSIS — R809 Proteinuria, unspecified: Secondary | ICD-10-CM | POA: Diagnosis not present

## 2023-04-22 DIAGNOSIS — E1165 Type 2 diabetes mellitus with hyperglycemia: Secondary | ICD-10-CM | POA: Diagnosis not present

## 2023-04-22 DIAGNOSIS — E1129 Type 2 diabetes mellitus with other diabetic kidney complication: Secondary | ICD-10-CM | POA: Diagnosis not present

## 2023-04-26 ENCOUNTER — Ambulatory Visit: Payer: 59

## 2023-04-28 ENCOUNTER — Encounter (HOSPITAL_COMMUNITY): Payer: Self-pay | Admitting: Certified Registered Nurse Anesthetist

## 2023-04-28 ENCOUNTER — Ambulatory Visit: Payer: 59

## 2023-04-29 DIAGNOSIS — Z86010 Personal history of colonic polyps: Secondary | ICD-10-CM

## 2023-05-02 ENCOUNTER — Telehealth: Payer: Self-pay | Admitting: *Deleted

## 2023-05-02 NOTE — Telephone Encounter (Signed)
CALLED PT. She has been rescheduled to 8/5 at 730am. She will come by office at [facility] to pick up prep sample and instructions for procedure. Message sent to [facility] making aware of change.

## 2023-05-03 ENCOUNTER — Ambulatory Visit (INDEPENDENT_AMBULATORY_CARE_PROVIDER_SITE_OTHER): Payer: 59

## 2023-05-03 DIAGNOSIS — N3941 Urge incontinence: Secondary | ICD-10-CM

## 2023-05-03 NOTE — Progress Notes (Signed)
PTNS   Session # 12 of 12  Health & Social Factors: diabetic Caffeine: tea all day Alcohol: no Daytime voids #per day: 6-7 Night-time voids #per night: 2-3 Urgency: yes Incontinence Episodes #per day: varies Ankle used: right Treatment Setting: 2 Feeling/ Response: positive sensation Comments: n/a  Performed By: Ovie Eastep LPN  Follow Up: keep scheduled nv

## 2023-05-16 ENCOUNTER — Other Ambulatory Visit: Payer: Self-pay

## 2023-05-16 ENCOUNTER — Encounter (HOSPITAL_COMMUNITY)
Admission: RE | Disposition: A | Discharge status: Home / Self Care | Payer: Self-pay | Source: Home / Self Care | Attending: Gastroenterology

## 2023-05-16 ENCOUNTER — Ambulatory Visit (HOSPITAL_BASED_OUTPATIENT_CLINIC_OR_DEPARTMENT_OTHER): Payer: 59 | Admitting: Anesthesiology

## 2023-05-16 ENCOUNTER — Ambulatory Visit (HOSPITAL_COMMUNITY)
Admission: RE | Admit: 2023-05-16 | Discharge: 2023-05-16 | Disposition: A | Discharge status: Home / Self Care | Payer: 59 | Attending: Gastroenterology | Admitting: Gastroenterology

## 2023-05-16 ENCOUNTER — Ambulatory Visit (HOSPITAL_COMMUNITY): Payer: 59 | Admitting: Anesthesiology

## 2023-05-16 ENCOUNTER — Encounter (HOSPITAL_COMMUNITY): Payer: Self-pay

## 2023-05-16 DIAGNOSIS — Z1211 Encounter for screening for malignant neoplasm of colon: Secondary | ICD-10-CM

## 2023-05-16 DIAGNOSIS — Z7984 Long term (current) use of oral hypoglycemic drugs: Secondary | ICD-10-CM | POA: Diagnosis not present

## 2023-05-16 DIAGNOSIS — K635 Polyp of colon: Secondary | ICD-10-CM

## 2023-05-16 DIAGNOSIS — K5730 Diverticulosis of large intestine without perforation or abscess without bleeding: Secondary | ICD-10-CM | POA: Insufficient documentation

## 2023-05-16 DIAGNOSIS — K6389 Other specified diseases of intestine: Secondary | ICD-10-CM | POA: Insufficient documentation

## 2023-05-16 DIAGNOSIS — K5520 Angiodysplasia of colon without hemorrhage: Secondary | ICD-10-CM | POA: Insufficient documentation

## 2023-05-16 DIAGNOSIS — F32A Depression, unspecified: Secondary | ICD-10-CM | POA: Diagnosis not present

## 2023-05-16 DIAGNOSIS — Z86010 Personal history of colonic polyps: Secondary | ICD-10-CM

## 2023-05-16 DIAGNOSIS — K648 Other hemorrhoids: Secondary | ICD-10-CM

## 2023-05-16 DIAGNOSIS — K219 Gastro-esophageal reflux disease without esophagitis: Secondary | ICD-10-CM | POA: Insufficient documentation

## 2023-05-16 DIAGNOSIS — K644 Residual hemorrhoidal skin tags: Secondary | ICD-10-CM | POA: Diagnosis not present

## 2023-05-16 DIAGNOSIS — E119 Type 2 diabetes mellitus without complications: Secondary | ICD-10-CM | POA: Insufficient documentation

## 2023-05-16 DIAGNOSIS — Z794 Long term (current) use of insulin: Secondary | ICD-10-CM | POA: Insufficient documentation

## 2023-05-16 HISTORY — PX: BIOPSY: SHX5522

## 2023-05-16 HISTORY — PX: COLONOSCOPY WITH PROPOFOL: SHX5780

## 2023-05-16 LAB — HM COLONOSCOPY

## 2023-05-16 LAB — GLUCOSE, CAPILLARY: Glucose-Capillary: 107 mg/dL — ABNORMAL HIGH (ref 70–99)

## 2023-05-16 SURGERY — COLONOSCOPY WITH PROPOFOL
Anesthesia: General

## 2023-05-16 MED ORDER — PROPOFOL 1000 MG/100ML IV EMUL
INTRAVENOUS | Status: AC
  Filled 2023-05-16: qty 200

## 2023-05-16 MED ORDER — PROPOFOL 10 MG/ML IV BOLUS
INTRAVENOUS | Status: AC
  Filled 2023-05-16: qty 20

## 2023-05-16 MED ORDER — LACTATED RINGERS IV SOLN: INTRAVENOUS | Status: DC | PRN

## 2023-05-16 MED ORDER — LIDOCAINE HCL (PF) 2 % IJ SOLN
INTRAMUSCULAR | Status: AC
  Filled 2023-05-16: qty 35

## 2023-05-16 MED ORDER — PHENYLEPHRINE 80 MCG/ML (10ML) SYRINGE FOR IV PUSH (FOR BLOOD PRESSURE SUPPORT)
PREFILLED_SYRINGE | INTRAVENOUS | Status: AC
  Filled 2023-05-16: qty 10

## 2023-05-16 MED ORDER — LACTATED RINGERS IV SOLN: INTRAVENOUS | Status: DC

## 2023-05-16 MED ORDER — PROPOFOL 10 MG/ML IV BOLUS
INTRAVENOUS | Status: DC | PRN
Start: 2023-05-16 — End: 2023-05-16
  Administered 2023-05-16: 80 mg via INTRAVENOUS

## 2023-05-16 MED ORDER — PROPOFOL 500 MG/50ML IV EMUL
INTRAVENOUS | Status: AC
  Filled 2023-05-16: qty 150

## 2023-05-16 MED ORDER — PROPOFOL 500 MG/50ML IV EMUL
INTRAVENOUS | Status: DC | PRN
  Administered 2023-05-16: 150 ug/kg/min via INTRAVENOUS

## 2023-05-16 MED ORDER — LIDOCAINE HCL (CARDIAC) PF 100 MG/5ML IV SOSY
PREFILLED_SYRINGE | INTRAVENOUS | Status: DC | PRN
  Administered 2023-05-16: 60 mg via INTRATRACHEAL

## 2023-05-16 NOTE — Discharge Instructions (Signed)

## 2023-05-16 NOTE — Anesthesia Preprocedure Evaluation (Addendum)
Anesthesia Evaluation  Patient identified by MRN, date of birth, ID band Patient awake    Reviewed: Allergy & Precautions, H&P , NPO status , Patient's Chart, lab work & pertinent test results  Airway Mallampati: III  TM Distance: >3 FB Neck ROM: Full    Dental  (+) Dental Advisory Given, Missing   Pulmonary  CT - Lungs/Pleura: Central airways are patent mild paraseptal emphysema. No consolidation, pleural effusion or pneumothorax. Stable irregular solid pulmonary nodule of the right lower lobe measuring 7 x 5 mm on series 4, image 80.   Upper Abdomen: Stable left adrenal gland adenoma, no specific follow-up imaging is recommended. No acute abnormality.    Pulmonary exam normal breath sounds clear to auscultation       Cardiovascular Exercise Tolerance: Good Normal cardiovascular exam Rhythm:Regular Rate:Normal     Neuro/Psych  PSYCHIATRIC DISORDERS  Depression    negative neurological ROS     GI/Hepatic Neg liver ROS,GERD  Medicated and Controlled,,  Endo/Other  diabetes, Well Controlled, Type 2, Oral Hypoglycemic Agents, Insulin Dependent    Renal/GU negative Renal ROS  negative genitourinary   Musculoskeletal negative musculoskeletal ROS (+)    Abdominal   Peds negative pediatric ROS (+)  Hematology negative hematology ROS (+)   Anesthesia Other Findings Left adrenal mass (HCC) Microscopic hematuria Urge incontinence of urine    Reproductive/Obstetrics negative OB ROS                             Anesthesia Physical Anesthesia Plan  ASA: 2  Anesthesia Plan: General   Post-op Pain Management: Minimal or no pain anticipated   Induction: Intravenous  PONV Risk Score and Plan: 1 and Treatment may vary due to age or medical condition and Propofol infusion  Airway Management Planned: Nasal Cannula and Natural Airway  Additional Equipment:   Intra-op Plan:    Post-operative Plan:   Informed Consent: I have reviewed the patients History and Physical, chart, labs and discussed the procedure including the risks, benefits and alternatives for the proposed anesthesia with the patient or authorized representative who has indicated his/her understanding and acceptance.     Dental advisory given  Plan Discussed with: CRNA and Surgeon  Anesthesia Plan Comments:        Anesthesia Quick Evaluation

## 2023-05-16 NOTE — H&P (Addendum)
Primary Care Physician:  Orlene Plum, NP Primary Gastroenterologist:  Dr. Tasia Catchings  Pre-Procedure History & Physical: HPI:  Laura Suarez is a 57 y.o. female is here for a colonoscopy for colon cancer screening purposes.  Patient denies any family history of colorectal cancer.  No melena or hematochezia.  No abdominal pain or unintentional weight loss.  No change in bowel habits.  Overall feels well from a GI standpoint.  Past Medical History:  Diagnosis Date   Depression    Diabetes mellitus without complication (HCC)    High cholesterol     Past Surgical History:  Procedure Laterality Date   ABDOMINAL HYSTERECTOMY      Prior to Admission medications   Medication Sig Start Date End Date Taking? Authorizing Provider  aspirin 81 MG EC tablet Take 81 mg by mouth daily.   Yes [provider]  atorvastatin (LIPITOR) 40 MG tablet Take 40 mg by mouth at bedtime. 05/19/21  Yes [provider]  Biotin 5000 MCG CAPS Take 5,000 mcg by mouth daily.   Yes [provider]  busPIRone (BUSPAR) 10 MG tablet Take 10 mg by mouth 3 (three) times daily.   Yes [provider]  Coenzyme Q10 100 MG CHEW Chew 100 mg by mouth daily.   Yes [provider]  FARXIGA 5 MG TABS tablet Take 5 mg by mouth every morning.   Yes [provider]  ferrous sulfate 324 MG TBEC Take 324 mg by mouth 4 (four) times a week.   Yes [provider]  hydrOXYzine (ATARAX/VISTARIL) 10 MG tablet Take 10 mg by mouth daily as needed for anxiety. 07/08/21  Yes [provider]  lisinopril (ZESTRIL) 2.5 MG tablet Take 2.5 mg by mouth every morning.   Yes [provider]  metFORMIN (GLUCOPHAGE) 1000 MG tablet Take 1,000 mg by mouth 2 (two) times daily. 06/10/21  Yes [provider]  omeprazole (PRILOSEC) 40 MG capsule Take 40 mg by mouth at bedtime. 06/10/21  Yes [provider]  OXcarbazepine (TRILEPTAL) 150 MG tablet Take 300 mg by mouth  at bedtime. 05/01/21  Yes [provider]  PARoxetine (PAXIL) 40 MG tablet Take 40 mg by mouth daily. 06/12/21  Yes [provider]  Probiotic Product (PROBIOTIC PO) Take 1 tablet by mouth 3 (three) times a week.   Yes [provider]  risperiDONE (RISPERDAL) 1 MG tablet Take 1-2 mg by mouth See admin instructions. Take 1 mg in the morning and 2 mg in the evening 07/08/21  Yes [provider]  traZODone (DESYREL) 150 MG tablet Take 150 mg by mouth at bedtime. 05/04/21  Yes [provider]  TRESIBA FLEXTOUCH 200 UNIT/ML FlexTouch Pen Inject 26 Units into the skin every evening. 07/01/22  Yes [provider]    Allergies as of 04/13/2023 - Review Complete 04/11/2023  Allergen Reaction Noted   Sulfa antibiotics Rash 06/24/2020   Ziprasidone hcl Other (See Comments) 06/24/2020    History reviewed. No pertinent family history.  Social History   Socioeconomic History   Marital status: Married    Spouse name: Not on file   Number of children: Not on file   Years of education: Not on file   Highest education level: Not on file  Occupational History   Not on file  Tobacco Use   Smoking status: Never   Smokeless tobacco: Never  Vaping Use   Vaping status: Never Used  Substance and Sexual Activity   Alcohol use: Not on  file   Drug use: Never   Sexual activity: Not on file  Other Topics Concern   Not on file  Social History Narrative   Not on file   Social Determinants of Health   Financial Resource Strain: Low Risk  (01/06/2022)   Received from [facility], [facility]   Overall Financial Resource Strain (CARDIA)    Difficulty of Paying Living Expenses: Not hard at all  Food Insecurity: No Food Insecurity (06/03/2021)   Received from [facility], [facility]   Hunger Vital Sign    Worried About Running Out of Food in the Last Year: Never true    Ran Out of Food in the Last Year: Never true  Transportation Needs: No  Transportation Needs (01/06/2022)   Received from [facility], [facility]   [facility] - Transportation    Lack of Transportation (Medical): No    Lack of Transportation (Non-Medical): No  Physical Activity: Insufficiently Active (06/03/2021)   Received from [facility], [facility]   Exercise Vital Sign    Days of Exercise per Week: 4 days    Minutes of Exercise per Session: 30 min  Stress: No Stress Concern Present (06/03/2021)   Received from [facility], [facility] - Occupational Stress Questionnaire    Feeling of Stress : Not at all  Social Connections: Socially Integrated (06/03/2021)   Received from [facility], [facility]   Social Connection and Isolation Panel [NHANES]    Frequency of Communication with Friends and Family: Twice a week    Frequency of Social Gatherings with Friends and Family: Twice a week    Attends Religious Services: 1 to 4 times per year    Active Member of Clubs or Organizations: No    Attends Banker Meetings: 1 to 4 times per year    Marital Status: Married  Catering manager Violence: Not At Risk (06/03/2021)   Received from [facility], [facility]   Humiliation, Afraid, Rape, and Kick questionnaire    Fear of Current or Ex-Partner: No    Emotionally Abused: No    Physically Abused: No    Sexually Abused: No    Review of Systems: See HPI, otherwise negative ROS  Physical Exam: Vital signs in last 24 hours: Temp:  [98 F (36.7 C)] 98 F (36.7 C) (08/05 0702) Pulse Rate:  [89] 89 (08/05 0702) Resp:  [22] 22 (08/05 0702) BP: (117)/(80) 117/80 (08/05 0702) SpO2:  [93 %] 93 % (08/05 0702) Weight:  [58.5 kg] 58.5 kg (08/05 0702)   General:   Alert,  Well-developed, well-nourished, pleasant and cooperative in NAD Head:  Normocephalic and atraumatic. Eyes:  Sclera clear, no icterus.   Conjunctiva pink. Ears:  Normal auditory acuity. Nose:  No  deformity, discharge,  or lesions. Msk:  Symmetrical without gross deformities. Normal posture. Extremities:  Without clubbing or edema. Neurologic:  Alert and  oriented x4;  grossly normal neurologically. Skin:  Intact without significant lesions or rashes. Psych:  Alert and cooperative. Normal mood and affect.  Impression/Plan: Laura Suarez is here for a colonoscopy to be performed for colon cancer screening purposes.  March 2024 with poor prep   The risks of the procedure including infection, bleed, or perforation as well as benefits, limitations, alternatives and imponderables have been reviewed with the patient. Questions have been answered. All parties agreeable.

## 2023-05-16 NOTE — Op Note (Signed)
[facility] Patient Name: Laura Suarez Procedure Date: 05/16/2023 7:14 AM MRN: [id number] Date of Birth: [birth year] Attending MD: Sanjuan Dame , MD, [id number] CSN: [id number] Age: 57 Admit Type: Outpatient Procedure:                Colonoscopy Indications:              Screening for colorectal malignant neoplasm Providers:                Sanjuan Dame, MD, Sheran Fava, Elinor Parkinson, Dyann Ruddle Referring MD:              Medicines:                Monitored Anesthesia Care Complications:            No immediate complications. Estimated Blood Loss:     Estimated blood loss: none. Procedure:                Pre-Anesthesia Assessment:                           - Prior to the procedure, a History and Physical                            was performed, and patient medications and                            allergies were reviewed. The patient's tolerance of                            previous anesthesia was also reviewed. The risks                            and benefits of the procedure and the sedation                            options and risks were discussed with the patient.                            All questions were answered, and informed consent                            was obtained. Prior Anticoagulants: The patient has                            taken no anticoagulant or antiplatelet agents. ASA                            Grade Assessment: II - A patient with mild systemic                            disease. After reviewing the risks and benefits,  the patient was deemed in satisfactory condition to                            undergo the procedure.                           After obtaining informed consent, the colonoscope                            was passed under direct vision. Throughout the                            procedure, the patient's blood pressure, pulse, and                             oxygen saturations were monitored continuously. The                            [id number]) scope was introduced through the                            anus and advanced to the the cecum, identified by                            appendiceal orifice and ileocecal valve. The                            colonoscopy was performed without difficulty. The                            patient tolerated the procedure well. The quality                            of the bowel preparation was evaluated using the                            BBPS [facility] Bowel Preparation Scale) with scores                            of: Right Colon = 2 (minor amount of residual                            staining, small fragments of stool and/or opaque                            liquid, but mucosa seen well), Transverse Colon = 2                            (minor amount of residual staining, small fragments                            of stool and/or opaque liquid, but mucosa seen  well) and Left Colon = 2 (minor amount of residual                            staining, small fragments of stool and/or opaque                            liquid, but mucosa seen well). The total BBPS score                            equals 6. The ileocecal valve, appendiceal orifice,                            and rectum were photographed. Scope In: 7:36:22 AM Scope Out: 8:04:23 AM Scope Withdrawal Time: 0 hours 18 minutes 4 seconds  Total Procedure Duration: 0 hours 28 minutes 1 second  Findings:      Scattered diverticula were found in the left colon.      A single angioectasia without bleeding was found in the ascending colon.      One 6 mm nodule was found at the hepatic flexure. Biopsies were taken       with a cold forceps for histology.      Non-bleeding external internal hemorrhoids were found during       retroflexion. Impression:               - Diverticulosis in the left colon.                            - A single non-bleeding colonic angioectasia. No                            intervention performed                           - Nodule at the hepatic flexure. Biopsied. Likely                            diverticulosis on an edematous fold .NBMI performed                            with similar mucosa as the colon                           - Non-bleeding external internal hemorrhoids. Moderate Sedation:      Per Anesthesia Care Recommendation:           - Repeat colonoscopy for surveillance based on                            pathology results.                           - Return to primary care physician as previously                            scheduled. Procedure Code(s):        --- Professional ---  86578, Colonoscopy, flexible; with biopsy, single                            or multiple Diagnosis Code(s):        --- Professional ---                           Z12.11, Encounter for screening for malignant                            neoplasm of colon                           K64.4, Residual hemorrhoidal skin tags                           K64.8, Other hemorrhoids                           K55.20, Angiodysplasia of colon without hemorrhage                           K63.89, Other specified diseases of intestine                           K57.30, Diverticulosis of large intestine without                            perforation or abscess without bleeding CPT copyright 2022 American Medical Association. All rights reserved. The codes documented in this report are preliminary and upon coder review may  be revised to meet current compliance requirements. Sanjuan Dame, MD Sanjuan Dame, MD 05/16/2023 8:12:52 AM This report has been signed electronically. Number of Addenda: 0

## 2023-05-16 NOTE — Transfer of Care (Signed)
Immediate Anesthesia Transfer of Care Note  Patient: Laura Suarez  Procedure(s) Performed: COLONOSCOPY WITH PROPOFOL BIOPSY  Patient Location: Endoscopy Unit  Anesthesia Type:General  Level of Consciousness: drowsy  Airway & Oxygen Therapy: Patient Spontanous Breathing  Post-op Assessment: Report given to RN and Post -op Vital signs reviewed and stable  Post vital signs: Reviewed and stable  Last Vitals:  Vitals Value Taken Time  BP 130/90   Temp 98   Pulse 92 05/16/23 0807  Resp 16 05/16/23 0807  SpO2      Last Pain:  Vitals:   05/16/23 0807  TempSrc: Oral  PainSc:          Complications: No notable events documented.

## 2023-05-16 NOTE — Anesthesia Postprocedure Evaluation (Signed)
Anesthesia Post Note  Patient: [name]  Procedure(s) Performed: COLONOSCOPY WITH PROPOFOL BIOPSY  Patient location during evaluation: Phase II Anesthesia Type: General Level of consciousness: awake and alert and oriented Pain management: pain level controlled Vital Signs Assessment: post-procedure vital signs reviewed and stable Respiratory status: spontaneous breathing, nonlabored ventilation and respiratory function stable Cardiovascular status: blood pressure returned to baseline and stable Postop Assessment: no apparent nausea or vomiting Anesthetic complications: no  No notable events documented.   Last Vitals:  Vitals:   05/16/23 0702 05/16/23 0807  BP: 117/80 (!) 154/87  Pulse: 89 92  Resp: (!) 22 16  Temp: 36.7 C 36.8 C  SpO2: 93%     Last Pain:  Vitals:   05/16/23 0807  TempSrc: Oral  PainSc: 0-No pain                  C 

## 2023-05-17 ENCOUNTER — Encounter (INDEPENDENT_AMBULATORY_CARE_PROVIDER_SITE_OTHER): Payer: Self-pay | Admitting: *Deleted

## 2023-05-19 ENCOUNTER — Encounter (HOSPITAL_COMMUNITY): Payer: Self-pay | Admitting: Gastroenterology

## 2023-05-19 NOTE — Progress Notes (Signed)
I reviewed the pathology results and spoke to the patient . Ann, can you please record the repeat interval  Repeat colonoscopy in 1 years  Thanks,  Vista Lawman, MD Gastroenterology and Hepatology [facility] Gastroenterology  ---------------------------------------------------------------------------------------------  Colonoscopy with hepatic flexure nodule which appeared to be diverticulum on a thick fold hence removal was not performed as no distinct polyp was demarcated after NBMI imaging,although  biopsies positive for hyperplastic changes .   Given these findings patient was advised to have repeat colonoscopy in 1 year to assess hepatic flexure again to ensure stability of the findings and no growth   Patient verbalizes understanding

## 2023-05-26 ENCOUNTER — Ambulatory Visit: Payer: 59 | Admitting: Urology

## 2023-06-07 DIAGNOSIS — L84 Corns and callosities: Secondary | ICD-10-CM | POA: Diagnosis not present

## 2023-06-07 DIAGNOSIS — M79676 Pain in unspecified toe(s): Secondary | ICD-10-CM | POA: Diagnosis not present

## 2023-06-07 DIAGNOSIS — B351 Tinea unguium: Secondary | ICD-10-CM | POA: Diagnosis not present

## 2023-06-07 DIAGNOSIS — E1142 Type 2 diabetes mellitus with diabetic polyneuropathy: Secondary | ICD-10-CM | POA: Diagnosis not present

## 2023-06-15 ENCOUNTER — Other Ambulatory Visit: Payer: Self-pay | Admitting: Urology

## 2023-06-15 DIAGNOSIS — D3502 Benign neoplasm of left adrenal gland: Secondary | ICD-10-CM | POA: Insufficient documentation

## 2023-06-15 DIAGNOSIS — R3129 Other microscopic hematuria: Secondary | ICD-10-CM

## 2023-06-15 NOTE — Progress Notes (Deleted)
Name: Laura Suarez DOB: [birth year] MRN: [id number]  History of Present Illness: Laura Suarez is a 57 y.o. female who presents today for follow up visit at [facility] [place]. - GU history: 1. OAB with urinary frequency, urgency, and urge incontinence. - Previously tried & failed Myrbetric, Gemtesa, Oxybutynin, and Toviaz.  2. Microscopic hematuria. - 09/21/2021: Negative cystoscopy evaluation. 3. Benign 2.2 cm left adrenal adenoma. Per CT on 09/15/2021.  At last visit with Dr. Ronne Binning on 12/10/2022: - Discussed PTNS, intravesical botox and interstim.  - Patient elected PTNS. - Advised RUS in 6 months for microscopic hematuria follow up.   Since last visit: Completed 12/12 PTNS sessions on 05/03/2023.  Today: RUS today: Awaiting radiology read; ***  She {Actions; denies-reports:120008} symptomatic improvement since completing 12 sessions of PTNS.   She reports {Blank multiple:19197::"improved","persistent / unchanged"} urinary ***frequency, ***nocturia, ***urgency, and ***urge incontinence. Voiding ***x/day and ***x/night on average. Leaking ***x/day on average; using *** ***pads / ***diapers per day on average.  She {Actions; denies-reports:120008} caffeine intake (*** caffeinated beverages per day on average).  She {Actions; denies-reports:120008} dysuria, gross hematuria, straining to void, or sensations of incomplete emptying.   Fall Screening: Do you usually have a device to assist in your mobility? {yes/no:20286} ***cane / ***walker / ***wheelchair   Medications: Current Outpatient Medications  Medication Sig Dispense Refill   aspirin 81 MG EC tablet Take 81 mg by mouth daily.     atorvastatin (LIPITOR) 40 MG tablet Take 40 mg by mouth at bedtime.     Biotin 5000 MCG CAPS Take 5,000 mcg by mouth daily.     busPIRone (BUSPAR) 10 MG tablet Take 10 mg by mouth 3 (three) times daily.     Coenzyme Q10 100 MG CHEW Chew 100 mg by mouth daily.     FARXIGA 5 MG  TABS tablet Take 5 mg by mouth every morning.     ferrous sulfate 324 MG TBEC Take 324 mg by mouth 4 (four) times a week.     hydrOXYzine (ATARAX/VISTARIL) 10 MG tablet Take 10 mg by mouth daily as needed for anxiety.     lisinopril (ZESTRIL) 2.5 MG tablet Take 2.5 mg by mouth every morning.     metFORMIN (GLUCOPHAGE) 1000 MG tablet Take 1,000 mg by mouth 2 (two) times daily.     omeprazole (PRILOSEC) 40 MG capsule Take 40 mg by mouth at bedtime.     OXcarbazepine (TRILEPTAL) 150 MG tablet Take 300 mg by mouth at bedtime.     PARoxetine (PAXIL) 40 MG tablet Take 40 mg by mouth daily.     Probiotic Product (PROBIOTIC PO) Take 1 tablet by mouth 3 (three) times a week.     risperiDONE (RISPERDAL) 1 MG tablet Take 1-2 mg by mouth See admin instructions. Take 1 mg in the morning and 2 mg in the evening     traZODone (DESYREL) 150 MG tablet Take 150 mg by mouth at bedtime.     TRESIBA FLEXTOUCH 200 UNIT/ML FlexTouch Pen Inject 26 Units into the skin every evening.     No current facility-administered medications for this visit.    Allergies: Allergies  Allergen Reactions   Sulfa Antibiotics Rash   Ziprasidone Hcl Other (See Comments)    Unable to move limbs      Past Medical History:  Diagnosis Date   Depression    Diabetes mellitus without complication (HCC)    High cholesterol    Past Surgical History:  Procedure Laterality Date  ABDOMINAL HYSTERECTOMY     BIOPSY  05/16/2023   Procedure: BIOPSY;  Surgeon: Franky Macho, MD;  Location: [facility];  Service: Endoscopy;;   COLONOSCOPY WITH PROPOFOL N/A 05/16/2023   Procedure: COLONOSCOPY WITH PROPOFOL;  Surgeon: Franky Macho, MD;  Location: [facility];  Service: Endoscopy;  Laterality: N/A;  7:30AM;ASA 2   No family history on file. Social History   Socioeconomic History   Marital status: Married    Spouse name: Not on file   Number of children: Not on file   Years of education: Not on file   Highest education  level: Not on file  Occupational History   Not on file  Tobacco Use   Smoking status: Never   Smokeless tobacco: Never  Vaping Use   Vaping status: Never Used  Substance and Sexual Activity   Alcohol use: Not on file   Drug use: Never   Sexual activity: Not on file  Other Topics Concern   Not on file  Social History Narrative   Not on file   Social Determinants of Health   Financial Resource Strain: Low Risk  (01/06/2022)   Received from [facility], [facility]   Overall Financial Resource Strain (CARDIA)    Difficulty of Paying Living Expenses: Not hard at all  Food Insecurity: No Food Insecurity (06/03/2021)   Received from [facility], [facility]   Hunger Vital Sign    Worried About Running Out of Food in the Last Year: Never true    Ran Out of Food in the Last Year: Never true  Transportation Needs: No Transportation Needs (01/06/2022)   Received from [facility], [facility]   [facility] - Transportation    Lack of Transportation (Medical): No    Lack of Transportation (Non-Medical): No  Physical Activity: Insufficiently Active (06/03/2021)   Received from [facility], [facility]   Exercise Vital Sign    Days of Exercise per Week: 4 days    Minutes of Exercise per Session: 30 min  Stress: No Stress Concern Present (06/03/2021)   Received from [facility], [facility] - Occupational Stress Questionnaire    Feeling of Stress : Not at all  Social Connections: Socially Integrated (06/03/2021)   Received from [facility], [facility]   Social Connection and Isolation Panel [NHANES]    Frequency of Communication with Friends and Family: Twice a week    Frequency of Social Gatherings with Friends and Family: Twice a week    Attends Religious Services: 1 to 4 times per year    Active Member of [employer] or Organizations: No    Attends Engineer, structural: 1 to 4 times per year     Marital Status: Married  Catering manager Violence: Not At Risk (06/03/2021)   Received from [facility], [facility]   Humiliation, Afraid, Rape, and Kick questionnaire    Fear of Current or Ex-Partner: No    Emotionally Abused: No    Physically Abused: No    Sexually Abused: No    Review of Systems Constitutional: Patient ***denies any unintentional weight loss or change in strength lntegumentary: Patient ***denies any rashes or pruritus Eyes: Patient denies ***dry eyes ENT: Patient ***denies dry mouth Cardiovascular: Patient ***denies chest pain or syncope Respiratory: Patient ***denies shortness of breath Gastrointestinal: Patient ***denies nausea, vomiting, constipation, or diarrhea Musculoskeletal: Patient ***denies muscle  cramps or weakness Neurologic: Patient ***denies convulsions or seizures Psychiatric: Patient ***denies memory problems Allergic/Immunologic: Patient ***denies recent allergic reaction(s) Hematologic/Lymphatic: Patient denies bleeding tendencies Endocrine: Patient ***denies heat/cold intolerance  GU: As per HPI.  OBJECTIVE There were no vitals filed for this visit. There is no height or weight on file to calculate BMI.  Physical Examination Constitutional: ***No obvious distress; patient is ***non-toxic appearing  Cardiovascular: ***No visible lower extremity edema.  Respiratory: The patient does ***not have audible wheezing/stridor; respirations do ***not appear labored  Gastrointestinal: Abdomen ***non-distended Musculoskeletal: ***Normal ROM of UEs  Skin: ***No obvious rashes/open sores  Neurologic: CN 2-12 grossly ***intact Psychiatric: Answered questions ***appropriately with ***normal affect  Hematologic/Lymphatic/Immunologic: ***No obvious bruises or sites of spontaneous bleeding  UA: ***negative / *** WBC/hpf, *** RBC/hpf, bacteria (***) PVR: *** ml  ASSESSMENT OAB (overactive bladder)  Urge incontinence of urine  Urinary  urgency  Urinary frequency  Microscopic hematuria  Adrenal adenoma, left *** We discussed the symptoms of overactive bladder (OAB), which include urinary urgency, frequency, nocturia, with or without urge incontinence.   While we may not know the exact etiology of OAB, several risk factors can be identified.  - We discussed this patient's neurogenic risk factors for OAB-type symptoms including ***T2DM, ***nicotine use, ***.  - Likely exacerbated by ***diuretic use, ***caffeine intake, ***consumption of bladder irritants such as (acidic foods, spicy foods, alcohol).   We discussed the following management options in detail including potential benefits, risks, and side effects: Behavioral therapy: Modify fluid intake Decreasing bladder irritants (such as caffeine, acidic foods, spicy foods, alcohol) Urge suppression strategies Bladder retraining / timed voiding Double voiding Medication(s): - For anticholinergic medications, we discussed the potential side effects of anticholinergics including dry eyes, dry mouth, constipation, cognitive impairment and urinary retention.  - For beta-3 agonist medication, we discussed the risk for urinary retention and the potential side effect of elevated blood pressure specific to Myrbetriq (which is more likely to occur in individuals with uncontrolled hypertension).  For refractory cases: PTNS (posterior tibial nerve stimulation) Sacral neuromodulation trial (Medtronic lnterStim or Axonics implant) Bladder Botox injections In extreme cases, SP tube placement  ***In order to further evaluate urinary incontinence, She was instructed to complete a 3-day bladder diary.  ***Discussed recommendation for urodynamic testing, which was described in detail including risks such as bleeding, infection, organ/tissue/nerve damage.  She decided to proceed with *** ***work on behavioral modifications including ***minimizing caffeine intake and working on ***timed  voiding.  Will plan for follow up in ***8 weeks / *** months / ***1 year or sooner if needed. Pt verbalized understanding and agreement. All questions were answered.  PLAN Advised the following: *** ***. Minimize caffeine intake. ***. Work on timed voiding. ***. Urge suppression strategies. ***. Double/ triple voiding. ***. No follow-ups on file.  No orders of the defined types were placed in this encounter.   It has been explained that the patient is to follow regularly with their PCP in addition to all other providers involved in their care and to follow instructions provided by these respective offices. Patient advised to contact urology clinic if any urologic-pertaining questions, concerns, new symptoms or problems arise in the interim period.  There are no Patient Instructions on file for this visit.  Electronically signed by:  Donnita Falls, FNP   06/15/23    9:53 AM

## 2023-06-16 ENCOUNTER — Telehealth: Payer: Self-pay

## 2023-06-16 ENCOUNTER — Ambulatory Visit: Payer: 59 | Admitting: Urology

## 2023-06-16 DIAGNOSIS — D3502 Benign neoplasm of left adrenal gland: Secondary | ICD-10-CM

## 2023-06-16 DIAGNOSIS — R3915 Urgency of urination: Secondary | ICD-10-CM

## 2023-06-16 DIAGNOSIS — N3281 Overactive bladder: Secondary | ICD-10-CM

## 2023-06-16 DIAGNOSIS — R350 Frequency of micturition: Secondary | ICD-10-CM

## 2023-06-16 DIAGNOSIS — N3941 Urge incontinence: Secondary | ICD-10-CM

## 2023-06-16 DIAGNOSIS — R3129 Other microscopic hematuria: Secondary | ICD-10-CM

## 2023-06-16 NOTE — Telephone Encounter (Signed)
Patient is made of Laura Suarez recommendation. Patient given centralized scheduling phone number to scheduled RUS. Voiced understanding.

## 2023-06-16 NOTE — Telephone Encounter (Signed)
-----   Message from Donnita Falls sent at 06/15/2023  9:48 AM EDT ----- Patient due for RUS for microscopic hematuria surveillance. Please assist with scheduling that before her appointment tomorrow. Thanks!

## 2023-06-27 ENCOUNTER — Ambulatory Visit (HOSPITAL_COMMUNITY)
Admission: RE | Admit: 2023-06-27 | Discharge: 2023-06-27 | Disposition: A | Discharge status: Home / Self Care | Payer: 59 | Source: Ambulatory Visit | Attending: Urology | Admitting: Urology

## 2023-06-27 DIAGNOSIS — R3129 Other microscopic hematuria: Secondary | ICD-10-CM

## 2023-06-28 ENCOUNTER — Telehealth: Payer: Self-pay

## 2023-06-28 NOTE — Telephone Encounter (Signed)
-----   Message from Laura Suarez sent at 06/27/2023  1:12 PM EDT ----- Please let pt know RUS was normal - no stones, masses, or hydronephrosis. Thanks.

## 2023-06-28 NOTE — Telephone Encounter (Signed)
Patient is made aware and voiced understanding.

## 2023-06-28 NOTE — Telephone Encounter (Signed)
Patient is returning your call, please call back at your convenience

## 2023-06-28 NOTE — Telephone Encounter (Signed)
Tried calling patient with no answer, left vm for return call 

## 2023-07-11 NOTE — H&P (View-Only) (Signed)
Name: Laura Suarez DOB: 1965/12/13 MRN: [id number]  History of Present Illness: Laura Suarez is a 57 y.o. female who presents today for follow up visit at [facility]. - GU history: 1. OAB with urinary frequency, urgency, and urge incontinence. - Previously tried & failed Myrbetric, Gemtesa, Oxybutynin, and Toviaz. 2. Microscopic hematuria. - 09/21/2021: Negative cystoscopy evaluation. 3. Benign 2.2 cm left adrenal adenoma. Per CT on 09/15/2021.  At last visit with Dr. Ronne Suarez on 12/10/2022: - Discussed PTNS, intravesical botox and interstim.  - Patient elected PTNS. - Advised RUS in 6 months for microscopic hematuria follow up.   Since last visit: - 05/03/2023: Completed 12/12 PTNS sessions.  - 06/27/2023: RUS negative; no GU stones, masses, or hydronephrosis.  Today: She denies any symptomatic improvement since completing 12 sessions of PTNS.   She reports persistent / unchanged urinary frequency, nocturia, urgency, and urge incontinence. Voiding >10x/day and 2x/night on average. Leaking several times throughout the day and has to change her clothes about 4x/day on average due to leakage; denies using pads or diapers at this time per her preference.    She reports caffeine intake: "I drink tea all day long".   She denies dysuria, gross hematuria, straining to void, or sensations of incomplete emptying.   Fall Screening: Do you usually have a device to assist in your mobility? No   Medications: Current Outpatient Medications  Medication Sig Dispense Refill   aspirin 81 MG EC tablet Take 81 mg by mouth daily.     atorvastatin (LIPITOR) 40 MG tablet Take 40 mg by mouth at bedtime.     Biotin 5000 MCG CAPS Take 5,000 mcg by mouth daily.     busPIRone (BUSPAR) 10 MG tablet Take 10 mg by mouth 3 (three) times daily.     Coenzyme Q10 100 MG CHEW Chew 100 mg by mouth daily.     FARXIGA 5 MG TABS tablet Take 5 mg by mouth every morning.     ferrous sulfate 324  MG TBEC Take 324 mg by mouth 4 (four) times a week.     hydrOXYzine (ATARAX/VISTARIL) 10 MG tablet Take 10 mg by mouth daily as needed for anxiety.     lisinopril (ZESTRIL) 2.5 MG tablet Take 2.5 mg by mouth every morning.     metFORMIN (GLUCOPHAGE) 1000 MG tablet Take 1,000 mg by mouth 2 (two) times daily.     omeprazole (PRILOSEC) 40 MG capsule Take 40 mg by mouth at bedtime.     OXcarbazepine (TRILEPTAL) 150 MG tablet Take 300 mg by mouth at bedtime.     PARoxetine (PAXIL) 40 MG tablet Take 40 mg by mouth daily.     Probiotic Product (PROBIOTIC PO) Take 1 tablet by mouth 3 (three) times a week.     risperiDONE (RISPERDAL) 1 MG tablet Take 1-2 mg by mouth See admin instructions. Take 1 mg in the morning and 2 mg in the evening     traZODone (DESYREL) 150 MG tablet Take 150 mg by mouth at bedtime.     TRESIBA FLEXTOUCH 200 UNIT/ML FlexTouch Pen Inject 26 Units into the skin every evening.     No current facility-administered medications for this visit.    Allergies: Allergies  Allergen Reactions   Sulfa Antibiotics Rash   Ziprasidone Hcl Other (See Comments)    Unable to move limbs      Past Medical History:  Diagnosis Date   Depression    Diabetes mellitus without complication (HCC)  High cholesterol    Past Surgical History:  Procedure Laterality Date   ABDOMINAL HYSTERECTOMY     BIOPSY  05/16/2023   Procedure: BIOPSY;  Surgeon: Laura Macho, MD;  Location: [facility];  Service: Endoscopy;;   COLONOSCOPY WITH PROPOFOL N/A 05/16/2023   Procedure: COLONOSCOPY WITH PROPOFOL;  Surgeon: Laura Macho, MD;  Location: [facility];  Service: Endoscopy;  Laterality: N/A;  7:30AM;ASA 2   No family history on file. Social History   Socioeconomic History   Marital status: Married    Spouse name: Not on file   Number of children: Not on file   Years of education: Not on file   Highest education level: Not on file  Occupational History   Not on file  Tobacco Use    Smoking status: Never   Smokeless tobacco: Never  Vaping Use   Vaping status: Never Used  Substance and Sexual Activity   Alcohol use: Not on file   Drug use: Never   Sexual activity: Not on file  Other Topics Concern   Not on file  Social History Narrative   Not on file   Social Determinants of Health   Financial Resource Strain: Low Risk  (01/06/2022)   Received from [facility], [facility]   Overall Financial Resource Strain (CARDIA)    Difficulty of Paying Living Expenses: Not hard at all  Food Insecurity: No Food Insecurity (06/03/2021)   Received from [facility], [facility]   Hunger Vital Sign    Worried About Running Out of Food in the Last Year: Never true    Ran Out of Food in the Last Year: Never true  Transportation Needs: No Transportation Needs (01/06/2022)   Received from [facility], [facility]   [facility] - Transportation    Lack of Transportation (Medical): No    Lack of Transportation (Non-Medical): No  Physical Activity: Insufficiently Active (06/03/2021)   Received from [facility], [facility]   Exercise Vital Sign    Days of Exercise per Week: 4 days    Minutes of Exercise per Session: 30 min  Stress: No Stress Concern Present (06/03/2021)   Received from [facility], [facility] - Occupational Stress Questionnaire    Feeling of Stress : Not at all  Social Connections: Socially Integrated (06/03/2021)   Received from [facility], [facility]   Social Connection and Isolation Panel [NHANES]    Frequency of Communication with Friends and Family: Twice a week    Frequency of Social Gatherings with Friends and Family: Twice a week    Attends Religious Services: 1 to 4 times per year    Active Member of [employer] or Organizations: No    Attends Engineer, structural: 1 to 4 times per year    Marital Status: Married  Catering manager Violence: Not At Risk  (06/03/2021)   Received from [facility], [facility]   Humiliation, Afraid, Rape, and Kick questionnaire    Fear of Current or Ex-Partner: No    Emotionally Abused: No    Physically Abused: No    Sexually Abused: No    Review of Systems Constitutional: Patient denies any unintentional weight loss or change in strength lntegumentary: Patient denies any rashes or pruritus Cardiovascular: Patient denies chest pain or syncope Respiratory: Patient denies shortness of breath Gastrointestinal: Patient denies nausea, vomiting, constipation, or diarrhea  Musculoskeletal: Patient denies muscle cramps or weakness Neurologic: Patient denies convulsions or seizures Psychiatric: Patient denies memory problems Allergic/Immunologic: Patient denies recent allergic reaction(s) Hematologic/Lymphatic: Patient denies bleeding tendencies Endocrine: Patient denies heat/cold intolerance  GU: As per HPI.  OBJECTIVE Vitals:   07/14/23 1122  BP: 117/77  Pulse: 96  Temp: 97.9 F (36.6 C)   There is no height or weight on file to calculate BMI.  Physical Examination Constitutional: No obvious distress; patient is non-toxic appearing  Cardiovascular: No visible lower extremity edema.  Respiratory: The patient does not have audible wheezing/stridor; respirations do not appear labored  Gastrointestinal: Abdomen non-distended Musculoskeletal: Normal ROM of UEs  Skin: No obvious rashes/open sores  Neurologic: CN 2-12 grossly intact Psychiatric: Answered questions appropriately with normal affect  Hematologic/Lymphatic/Immunologic: No obvious bruises or sites of spontaneous bleeding  UA: 3-10 RBC/hpf with no evidence of UTI PVR: 0 ml  ASSESSMENT OAB (overactive bladder) - Plan: Urinalysis, Routine w reflex microscopic, BLADDER SCAN AMB NON-IMAGING, Ambulatory Referral For Surgery Scheduling  Urge incontinence of urine - Plan: Urinalysis, Routine w reflex microscopic, BLADDER SCAN AMB  NON-IMAGING, Ambulatory Referral For Surgery Scheduling  Microscopic hematuria - Plan: Urinalysis, Routine w reflex microscopic, BLADDER SCAN AMB NON-IMAGING  Urinary urgency - Plan: Ambulatory Referral For Surgery Scheduling  Urinary frequency - Plan: Ambulatory Referral For Surgery Scheduling  Nocturia - Plan: Ambulatory Referral For Surgery Scheduling  We discussed the symptoms of overactive bladder (OAB), which include urinary urgency, frequency, nocturia, with or without urge incontinence.   While we may not know the exact etiology of OAB, several risk factors can be identified.  - We discussed this patient's neurogenic risk factors for OAB-type symptoms including T2DM. - Likely exacerbated by caffeine intake.  We discussed the following management options in detail including potential benefits, risks, and side effects: Behavioral therapy: Modify fluid intake Decreasing bladder irritants (such as caffeine, acidic foods, spicy foods, alcohol) Urge suppression strategies Bladder retraining / timed voiding Double voiding Medication(s): - For anticholinergic medications, we discussed the potential side effects of anticholinergics including dry eyes, dry mouth, constipation, cognitive impairment and urinary retention.  - For beta-3 agonist medication, we discussed the risk for urinary retention and the potential side effect of elevated blood pressure specific to Myrbetriq (which is more likely to occur in individuals with uncontrolled hypertension).  For refractory cases: PTNS (posterior tibial nerve stimulation) Sacral neuromodulation trial (Medtronic lnterStim or Axonics implant) Bladder Botox injections In extreme cases, SP tube placement  She decided to proceed with sacral neuromodulation trial (Medtronic lnterStim) and to work on minimizing caffeine intake. Surgery request submitted for Dr. Ronne Suarez. Informational handouts about sacral neuromodulation provided to patient.  Pt  verbalized understanding and agreement. All questions were answered.  PLAN Advised the following: 1. Minimize caffeine intake. 2. Surgery request submitted (Dr. Ronne Suarez) for sacral neuromodulation trial Stage 1 & 2 with Medtronic lnterStim.  Orders Placed This Encounter  Procedures   Urinalysis, Routine w reflex microscopic   Ambulatory Referral For Surgery Scheduling    Referral Priority:   Routine    Referral Type:   Consultation    Number of Visits Requested:   1   BLADDER SCAN AMB NON-IMAGING    It has been explained that the patient is to follow regularly with their PCP in addition to all other providers involved in their care and to follow instructions provided by these respective offices. Patient advised to contact urology clinic if any urologic-pertaining questions, concerns, new symptoms or problems arise in  the interim period.  Patient Instructions  Overactive bladder (OAB) overview for patients:  Symptoms may include: urinary urgency ("gotta go" feeling) urinary frequency (voiding >8 times per day) night time urination (nocturia) urge incontinence of urine (UUI)  While we do not know the exact etiology of OAB, several treatment options exist including:  Behavioral therapy: Reducing fluid intake Decreasing bladder stimulants (such as caffeine) and irritants (such as acidic food, spicy foods, alcohol) Urge suppression strategies Bladder retraining via timed voiding  Pelvic floor physical therapy  Medication(s) - can use one or both of the drug classes below. Anticholinergic / antimuscarinic medications:  Mechanism of action: Activate M3 receptors to reduce detrusor stimulation and increase bladder capacity   (parasympathetic nervous system). Effect: Relaxes the bladder to decrease overactivity, increase bladder storage capacity, and increase time between voids. Onset: Slow acting (may take 8-12 weeks to determine efficacy). Medications include: Vesicare  (Solifenacin), Ditropan (Oxybutynin), Detrol (Tolterodine), Toviaz (Fesoterodine), Sanctura (Trospium), Urispas (Flavoxate), Enablex (Darifenacin), Bentyl (Dicyclomine), Levsin (Hyoscyamine ). Potential side effects include but are not limited to: Dry eyes, dry mouth, constipation, cognitive impairment, dementia risk with long term use, and urinary retention/ incomplete bladder emptying. Insurance companies generally prefer for patients to try 1-2 anticholinergic / antimuscarinic medications first due to low cost. Some exceptions are made based on patient-specific comorbidities / risk factors. Beta-3 agonist medications: Mechanism of action: Stimulates selective B3 adrenergic receptors to cause smooth muscle bladder relaxation (sympathetic nervous system). Effect: Relaxes the bladder to decrease overactivity, increase bladder storage capacity, and increase time between voids. Onset: Slow acting (may take 8-12 weeks to determine efficacy). Medications include: Myrbetriq (Mirabegron) and Vibegron Leslye Peer). Potential side effects include but are not limited to: urinary retention / incomplete bladder emptying and elevated blood pressure (more likely to occur in individuals with pre-existing uncontrolled hypertension). These medications tend to be more expensive than the anticholinergic / antimuscarinic medications.   For patients with refractory OAB (if the above treatment options have been unsuccessful): Posterior tibial nerve stimulation (PTNS). Small acupuncture-type needle inserted near ankle with electric current to stimulate bladder via posterior tibial nerve pathway. Initially requires 12 weekly in-office treatments lasting 30 minutes each; followed by monthly in-office treatments lasting 30 minutes each for 1 year.  Bladder Botox injections. How it is done: Typically done via in-office cystoscopy; sometimes done in the OR depending on the situation. The bladder is numbed with lidocaine instilled  via a catheter. Then the urologist injects Botox into the bladder muscle wall in about 20 locations. Causes local paralysis of the bladder muscle at the injection sites to reduce bladder muscle overactivity / spasms. The effect lasts for approximately 6 months and cannot be reversed once performed. Risks may included but are not limited to: infection, incomplete bladder emptying/ urinary retention, short term need for self-catheterization or indwelling catheter, and need for repeat therapy. There is a 5-12% chance of needing to catheterize with Botox - that usually resolves in a few months as the Botox wears off. Typically Botox injections would need to be repeated every 3-12 months since this is not a permanent therapy.  Sacral neuromodulation trial (Medtronic lnterStim or Axonics implant). Sacral neuromodulation is FDA-approved for uncontrolled urinary urgency, urinary frequency, urinary urge incontinence, non-obstructive urinary retention, or fecal incontinence. It is not FDA-approved as a treatment for pain. The goal of this therapy is at least a 50% improvement in symptoms. It is NOT realistic to expect a 100% cure. This is a a 2-step outpatient procedure. After a successful  test period, a permanent wire and generator are placed in the OR. We discussed the risk of infection. We reviewed the fact that about 30% of patients fail the test phase and are not candidates for permanent generator placement. During the 1-2 week trial phase, symptoms are documented by the patient to determine response. If patient gets at least a 50% improvement in symptoms, they may then proceed with Step 2. Step 1: Trial lead placement. Per physician discretion, may done one of two ways: Percutaneous nerve evaluation (PNE) in the [facility] urology office. Performed by urologist under local anesthesia (numbing the area with lidocaine) using a spinal needle for placement of test wire, which usually stays in place for 5-7 days to  determine therapy response. Test lead placement in OR under anesthesia. Usually stays in place 2 weeks to determine therapy response. > Step 2: Permanent implantation of sacral neuromodulation device, which is performed in the OR.  Sacral neuromodulation implants: All are conditionally MRI safe. Manufacturer: Medtronic Website: [employer]er.gl therapy/right-for-you.html Options: lnterStim X: Non-rechargeable. The battery lasts 10 years on average. lnterStim Micro: Rechargeable. The battery lasts 15 years on average and must be charged routinely. Approximately 50% smaller implant than lnterStim X implant.  Manufacturer: Axonics Website: Findrealrelief.axonics.com Options: Non-rechargeable (Axonics F15): The battery lasts 15 years on average. Rechargeable (Axonics R20): The battery lasts 20 years on average and must be charged in office for about 1 hour every 6-10 months on average. Approximately 50% smaller implant than Axonics non-rechargeable implant.  Note: Generally the rechargeable devices are only advised for very small or thin patients who may not have sufficient adipose tissue to comfortably overlay the implanted device.  Suprapubic catheter (SP tube) placement. Only done in severely refractory OAB when all other options have failed or are not a viable treatment choice depending on patient factors. Involves placement of a catheter through the lower abdomen into the bladder to continuously drain the bladder into an external collection bag, which patient can then empty at their convenience every few hours. Done via an outpatient surgical procedure in the OR under anesthesia. Risks may included but are not limited to: surgical site pain, infections, skin irritation / breakdown, chronic bacteriuria, symptomatic UTls. The SP tube must stay in place continuously. This is a reversible procedure however - the  insertion site will close if catheter is removed for more than a few hours. The SP tube must be exchanged routinely every 4 weeks to prevent the catheter from becoming clogged with sediment. SP tube exchanges are typically performed at a urology nurse visit or by a home health nurse.    ----------------------------------------------------------------------------------------------------------    Sacral neuromodulation is FDA approved for: Urinary urgency Urinary frequency Urge urinary incontinence Urinary retention Fecal incontinence Sacral neuromodulation is not indicated for pain management. The goal of this therapy is at least a 50% improvement in symptoms. It is NOT realistic to expect a 100% cure. About 70-80% of patients successfully achieve >50% symptomatic improvement and proceed with permanent sacral neuromodulation generator placement. About 20-30% of patients fail the test phase and are not candidates for permanent sacral neuromodulation generator placement.  After a successful test phase, patient goes to the OR for permanent wire and generator implantation.  Sacral neuromodulation options: Medtronic lnterStim: GameDayAccessories.fi.html  Axonics: https://www.axonics.com/hep/axonics-system/system-overview     Electronically signed by:  Donnita Falls, FNP   07/14/23    12:15 PM

## 2023-07-11 NOTE — Progress Notes (Unsigned)
Name: Cale Moraitis DOB: 1965/12/13 MRN: [id number]  History of Present Illness: [name] is a 57 y.o. female who presents today for follow up visit at [facility]. - GU history: 1. OAB with urinary frequency, urgency, and urge incontinence. - Previously tried & failed Myrbetric, Gemtesa, Oxybutynin, and Toviaz. 2. Microscopic hematuria. - 09/21/2021: Negative cystoscopy evaluation. 3. Benign 2.2 cm left adrenal adenoma. Per CT on 09/15/2021.  At last visit with Dr. Ronne Binning on 12/10/2022: - Discussed PTNS, intravesical botox and interstim.  - Patient elected PTNS. - Advised RUS in 6 months for microscopic hematuria follow up.   Since last visit: - 05/03/2023: Completed 12/12 PTNS sessions.  - 06/27/2023: RUS negative; no GU stones, masses, or hydronephrosis.  Today: She denies any symptomatic improvement since completing 12 sessions of PTNS.   She reports persistent / unchanged urinary frequency, nocturia, urgency, and urge incontinence. Voiding >10x/day and 2x/night on average. Leaking several times throughout the day and has to change her clothes about 4x/day on average due to leakage; denies using pads or diapers at this time per her preference.    She reports caffeine intake: "I drink tea all day long".   She denies dysuria, gross hematuria, straining to void, or sensations of incomplete emptying.   Fall Screening: Do you usually have a device to assist in your mobility? No   Medications: Current Outpatient Medications  Medication Sig Dispense Refill   aspirin 81 MG EC tablet Take 81 mg by mouth daily.     atorvastatin (LIPITOR) 40 MG tablet Take 40 mg by mouth at bedtime.     Biotin 5000 MCG CAPS Take 5,000 mcg by mouth daily.     busPIRone (BUSPAR) 10 MG tablet Take 10 mg by mouth 3 (three) times daily.     Coenzyme Q10 100 MG CHEW Chew 100 mg by mouth daily.     FARXIGA 5 MG TABS tablet Take 5 mg by mouth every morning.     ferrous sulfate 324  MG TBEC Take 324 mg by mouth 4 (four) times a week.     hydrOXYzine (ATARAX/VISTARIL) 10 MG tablet Take 10 mg by mouth daily as needed for anxiety.     lisinopril (ZESTRIL) 2.5 MG tablet Take 2.5 mg by mouth every morning.     metFORMIN (GLUCOPHAGE) 1000 MG tablet Take 1,000 mg by mouth 2 (two) times daily.     omeprazole (PRILOSEC) 40 MG capsule Take 40 mg by mouth at bedtime.     OXcarbazepine (TRILEPTAL) 150 MG tablet Take 300 mg by mouth at bedtime.     PARoxetine (PAXIL) 40 MG tablet Take 40 mg by mouth daily.     Probiotic Product (PROBIOTIC PO) Take 1 tablet by mouth 3 (three) times a week.     risperiDONE (RISPERDAL) 1 MG tablet Take 1-2 mg by mouth See admin instructions. Take 1 mg in the morning and 2 mg in the evening     traZODone (DESYREL) 150 MG tablet Take 150 mg by mouth at bedtime.     TRESIBA FLEXTOUCH 200 UNIT/ML FlexTouch Pen Inject 26 Units into the skin every evening.     No current facility-administered medications for this visit.    Allergies: Allergies  Allergen Reactions   Sulfa Antibiotics Rash   Ziprasidone Hcl Other (See Comments)    Unable to move limbs      Past Medical History:  Diagnosis Date   Depression    Diabetes mellitus without complication (HCC)  High cholesterol    Past Surgical History:  Procedure Laterality Date   ABDOMINAL HYSTERECTOMY     BIOPSY  05/16/2023   Procedure: BIOPSY;  Surgeon: Franky Macho, MD;  Location: [facility];  Service: Endoscopy;;   COLONOSCOPY WITH PROPOFOL N/A 05/16/2023   Procedure: COLONOSCOPY WITH PROPOFOL;  Surgeon: Franky Macho, MD;  Location: [facility];  Service: Endoscopy;  Laterality: N/A;  7:30AM;ASA 2   No family history on file. Social History   Socioeconomic History   Marital status: Married    Spouse name: Not on file   Number of children: Not on file   Years of education: Not on file   Highest education level: Not on file  Occupational History   Not on file  Tobacco Use    Smoking status: Never   Smokeless tobacco: Never  Vaping Use   Vaping status: Never Used  Substance and Sexual Activity   Alcohol use: Not on file   Drug use: Never   Sexual activity: Not on file  Other Topics Concern   Not on file  Social History Narrative   Not on file   Social Determinants of Health   Financial Resource Strain: Low Risk  (01/06/2022)   Received from [facility], [facility]   Overall Financial Resource Strain (CARDIA)    Difficulty of Paying Living Expenses: Not hard at all  Food Insecurity: No Food Insecurity (06/03/2021)   Received from [facility], [facility]   Hunger Vital Sign    Worried About Running Out of Food in the Last Year: Never true    Ran Out of Food in the Last Year: Never true  Transportation Needs: No Transportation Needs (01/06/2022)   Received from [facility], [facility]   [facility] - Transportation    Lack of Transportation (Medical): No    Lack of Transportation (Non-Medical): No  Physical Activity: Insufficiently Active (06/03/2021)   Received from [facility], [facility]   Exercise Vital Sign    Days of Exercise per Week: 4 days    Minutes of Exercise per Session: 30 min  Stress: No Stress Concern Present (06/03/2021)   Received from [facility], [facility] - Occupational Stress Questionnaire    Feeling of Stress : Not at all  Social Connections: Socially Integrated (06/03/2021)   Received from [facility], [facility]   Social Connection and Isolation Panel [NHANES]    Frequency of Communication with Friends and Family: Twice a week    Frequency of Social Gatherings with Friends and Family: Twice a week    Attends Religious Services: 1 to 4 times per year    Active Member of [employer] or Organizations: No    Attends Engineer, structural: 1 to 4 times per year    Marital Status: Married  Catering manager Violence: Not At Risk  (06/03/2021)   Received from [facility], [facility]   Humiliation, Afraid, Rape, and Kick questionnaire    Fear of Current or Ex-Partner: No    Emotionally Abused: No    Physically Abused: No    Sexually Abused: No    Review of Systems Constitutional: Patient denies any unintentional weight loss or change in strength lntegumentary: Patient denies any rashes or pruritus Cardiovascular: Patient denies chest pain or syncope Respiratory: Patient denies shortness of breath Gastrointestinal: Patient denies nausea, vomiting, constipation, or diarrhea  Musculoskeletal: Patient denies muscle cramps or weakness Neurologic: Patient denies convulsions or seizures Psychiatric: Patient denies memory problems Allergic/Immunologic: Patient denies recent allergic reaction(s) Hematologic/Lymphatic: Patient denies bleeding tendencies Endocrine: Patient denies heat/cold intolerance  GU: As per HPI.  OBJECTIVE Vitals:   07/14/23 1122  BP: 117/77  Pulse: 96  Temp: 97.9 F (36.6 C)   There is no height or weight on file to calculate BMI.  Physical Examination Constitutional: No obvious distress; patient is non-toxic appearing  Cardiovascular: No visible lower extremity edema.  Respiratory: The patient does not have audible wheezing/stridor; respirations do not appear labored  Gastrointestinal: Abdomen non-distended Musculoskeletal: Normal ROM of UEs  Skin: No obvious rashes/open sores  Neurologic: CN 2-12 grossly intact Psychiatric: Answered questions appropriately with normal affect  Hematologic/Lymphatic/Immunologic: No obvious bruises or sites of spontaneous bleeding  UA: 3-10 RBC/hpf with no evidence of UTI PVR: 0 ml  ASSESSMENT OAB (overactive bladder) - Plan: Urinalysis, Routine w reflex microscopic, BLADDER SCAN AMB NON-IMAGING, Ambulatory Referral For Surgery Scheduling  Urge incontinence of urine - Plan: Urinalysis, Routine w reflex microscopic, BLADDER SCAN AMB  NON-IMAGING, Ambulatory Referral For Surgery Scheduling  Microscopic hematuria - Plan: Urinalysis, Routine w reflex microscopic, BLADDER SCAN AMB NON-IMAGING  Urinary urgency - Plan: Ambulatory Referral For Surgery Scheduling  Urinary frequency - Plan: Ambulatory Referral For Surgery Scheduling  Nocturia - Plan: Ambulatory Referral For Surgery Scheduling  We discussed the symptoms of overactive bladder (OAB), which include urinary urgency, frequency, nocturia, with or without urge incontinence.   While we may not know the exact etiology of OAB, several risk factors can be identified.  - We discussed this patient's neurogenic risk factors for OAB-type symptoms including T2DM. - Likely exacerbated by caffeine intake.  We discussed the following management options in detail including potential benefits, risks, and side effects: Behavioral therapy: Modify fluid intake Decreasing bladder irritants (such as caffeine, acidic foods, spicy foods, alcohol) Urge suppression strategies Bladder retraining / timed voiding Double voiding Medication(s): - For anticholinergic medications, we discussed the potential side effects of anticholinergics including dry eyes, dry mouth, constipation, cognitive impairment and urinary retention.  - For beta-3 agonist medication, we discussed the risk for urinary retention and the potential side effect of elevated blood pressure specific to Myrbetriq (which is more likely to occur in individuals with uncontrolled hypertension).  For refractory cases: PTNS (posterior tibial nerve stimulation) Sacral neuromodulation trial (Medtronic lnterStim or Axonics implant) Bladder Botox injections In extreme cases, SP tube placement  She decided to proceed with sacral neuromodulation trial (Medtronic lnterStim) and to work on minimizing caffeine intake. Surgery request submitted for Dr. Ronne Binning. Informational handouts about sacral neuromodulation provided to patient.  Pt  verbalized understanding and agreement. All questions were answered.  PLAN Advised the following: 1. Minimize caffeine intake. 2. Surgery request submitted (Dr. Ronne Binning) for sacral neuromodulation trial Stage 1 & 2 with Medtronic lnterStim.  Orders Placed This Encounter  Procedures   Urinalysis, Routine w reflex microscopic   Ambulatory Referral For Surgery Scheduling    Referral Priority:   Routine    Referral Type:   Consultation    Number of Visits Requested:   1   BLADDER SCAN AMB NON-IMAGING    It has been explained that the patient is to follow regularly with their PCP in addition to all other providers involved in their care and to follow instructions provided by these respective offices. Patient advised to contact urology clinic if any urologic-pertaining questions, concerns, new symptoms or problems arise in  the interim period.  Patient Instructions  Overactive bladder (OAB) overview for patients:  Symptoms may include: urinary urgency ("gotta go" feeling) urinary frequency (voiding >8 times per day) night time urination (nocturia) urge incontinence of urine (UUI)  While we do not know the exact etiology of OAB, several treatment options exist including:  Behavioral therapy: Reducing fluid intake Decreasing bladder stimulants (such as caffeine) and irritants (such as acidic food, spicy foods, alcohol) Urge suppression strategies Bladder retraining via timed voiding  Pelvic floor physical therapy  Medication(s) - can use one or both of the drug classes below. Anticholinergic / antimuscarinic medications:  Mechanism of action: Activate M3 receptors to reduce detrusor stimulation and increase bladder capacity   (parasympathetic nervous system). Effect: Relaxes the bladder to decrease overactivity, increase bladder storage capacity, and increase time between voids. Onset: Slow acting (may take 8-12 weeks to determine efficacy). Medications include: Vesicare  (Solifenacin), Ditropan (Oxybutynin), Detrol (Tolterodine), Toviaz (Fesoterodine), Sanctura (Trospium), Urispas (Flavoxate), Enablex (Darifenacin), Bentyl (Dicyclomine), Levsin (Hyoscyamine ). Potential side effects include but are not limited to: Dry eyes, dry mouth, constipation, cognitive impairment, dementia risk with long term use, and urinary retention/ incomplete bladder emptying. Insurance companies generally prefer for patients to try 1-2 anticholinergic / antimuscarinic medications first due to low cost. Some exceptions are made based on patient-specific comorbidities / risk factors. Beta-3 agonist medications: Mechanism of action: Stimulates selective B3 adrenergic receptors to cause smooth muscle bladder relaxation (sympathetic nervous system). Effect: Relaxes the bladder to decrease overactivity, increase bladder storage capacity, and increase time between voids. Onset: Slow acting (may take 8-12 weeks to determine efficacy). Medications include: Myrbetriq (Mirabegron) and Vibegron Leslye Peer). Potential side effects include but are not limited to: urinary retention / incomplete bladder emptying and elevated blood pressure (more likely to occur in individuals with pre-existing uncontrolled hypertension). These medications tend to be more expensive than the anticholinergic / antimuscarinic medications.   For patients with refractory OAB (if the above treatment options have been unsuccessful): Posterior tibial nerve stimulation (PTNS). Small acupuncture-type needle inserted near ankle with electric current to stimulate bladder via posterior tibial nerve pathway. Initially requires 12 weekly in-office treatments lasting 30 minutes each; followed by monthly in-office treatments lasting 30 minutes each for 1 year.  Bladder Botox injections. How it is done: Typically done via in-office cystoscopy; sometimes done in the OR depending on the situation. The bladder is numbed with lidocaine instilled  via a catheter. Then the urologist injects Botox into the bladder muscle wall in about 20 locations. Causes local paralysis of the bladder muscle at the injection sites to reduce bladder muscle overactivity / spasms. The effect lasts for approximately 6 months and cannot be reversed once performed. Risks may included but are not limited to: infection, incomplete bladder emptying/ urinary retention, short term need for self-catheterization or indwelling catheter, and need for repeat therapy. There is a 5-12% chance of needing to catheterize with Botox - that usually resolves in a few months as the Botox wears off. Typically Botox injections would need to be repeated every 3-12 months since this is not a permanent therapy.  Sacral neuromodulation trial (Medtronic lnterStim or Axonics implant). Sacral neuromodulation is FDA-approved for uncontrolled urinary urgency, urinary frequency, urinary urge incontinence, non-obstructive urinary retention, or fecal incontinence. It is not FDA-approved as a treatment for pain. The goal of this therapy is at least a 50% improvement in symptoms. It is NOT realistic to expect a 100% cure. This is a a 2-step outpatient procedure. After a successful  test period, a permanent wire and generator are placed in the OR. We discussed the risk of infection. We reviewed the fact that about 30% of patients fail the test phase and are not candidates for permanent generator placement. During the 1-2 week trial phase, symptoms are documented by the patient to determine response. If patient gets at least a 50% improvement in symptoms, they may then proceed with Step 2. Step 1: Trial lead placement. Per physician discretion, may done one of two ways: Percutaneous nerve evaluation (PNE) in the [facility] urology office. Performed by urologist under local anesthesia (numbing the area with lidocaine) using a spinal needle for placement of test wire, which usually stays in place for 5-7 days to  determine therapy response. Test lead placement in OR under anesthesia. Usually stays in place 2 weeks to determine therapy response. > Step 2: Permanent implantation of sacral neuromodulation device, which is performed in the OR.  Sacral neuromodulation implants: All are conditionally MRI safe. Manufacturer: Medtronic Website: [employer]er.gl therapy/right-for-you.html Options: lnterStim X: Non-rechargeable. The battery lasts 10 years on average. lnterStim Micro: Rechargeable. The battery lasts 15 years on average and must be charged routinely. Approximately 50% smaller implant than lnterStim X implant.  Manufacturer: Axonics Website: Findrealrelief.axonics.com Options: Non-rechargeable (Axonics F15): The battery lasts 15 years on average. Rechargeable (Axonics R20): The battery lasts 20 years on average and must be charged in office for about 1 hour every 6-10 months on average. Approximately 50% smaller implant than Axonics non-rechargeable implant.  Note: Generally the rechargeable devices are only advised for very small or thin patients who may not have sufficient adipose tissue to comfortably overlay the implanted device.  Suprapubic catheter (SP tube) placement. Only done in severely refractory OAB when all other options have failed or are not a viable treatment choice depending on patient factors. Involves placement of a catheter through the lower abdomen into the bladder to continuously drain the bladder into an external collection bag, which patient can then empty at their convenience every few hours. Done via an outpatient surgical procedure in the OR under anesthesia. Risks may included but are not limited to: surgical site pain, infections, skin irritation / breakdown, chronic bacteriuria, symptomatic UTls. The SP tube must stay in place continuously. This is a reversible procedure however - the  insertion site will close if catheter is removed for more than a few hours. The SP tube must be exchanged routinely every 4 weeks to prevent the catheter from becoming clogged with sediment. SP tube exchanges are typically performed at a urology nurse visit or by a home health nurse.    ----------------------------------------------------------------------------------------------------------    Sacral neuromodulation is FDA approved for: Urinary urgency Urinary frequency Urge urinary incontinence Urinary retention Fecal incontinence Sacral neuromodulation is not indicated for pain management. The goal of this therapy is at least a 50% improvement in symptoms. It is NOT realistic to expect a 100% cure. About 70-80% of patients successfully achieve >50% symptomatic improvement and proceed with permanent sacral neuromodulation generator placement. About 20-30% of patients fail the test phase and are not candidates for permanent sacral neuromodulation generator placement.  After a successful test phase, patient goes to the OR for permanent wire and generator implantation.  Sacral neuromodulation options: Medtronic lnterStim: GameDayAccessories.fi.html  Axonics: https://www.axonics.com/hep/axonics-system/system-overview     Electronically signed by:  Donnita Falls, FNP   07/14/23    12:15 PM

## 2023-07-14 ENCOUNTER — Ambulatory Visit: Payer: 59 | Admitting: Urology

## 2023-07-14 ENCOUNTER — Encounter: Payer: Self-pay | Admitting: Urology

## 2023-07-14 VITALS — BP 117/77 | HR 96 | Temp 97.9°F

## 2023-07-14 DIAGNOSIS — R351 Nocturia: Secondary | ICD-10-CM

## 2023-07-14 DIAGNOSIS — N3941 Urge incontinence: Secondary | ICD-10-CM

## 2023-07-14 DIAGNOSIS — N3281 Overactive bladder: Secondary | ICD-10-CM | POA: Diagnosis not present

## 2023-07-14 DIAGNOSIS — R3129 Other microscopic hematuria: Secondary | ICD-10-CM

## 2023-07-14 DIAGNOSIS — R350 Frequency of micturition: Secondary | ICD-10-CM

## 2023-07-14 DIAGNOSIS — R3915 Urgency of urination: Secondary | ICD-10-CM

## 2023-07-14 LAB — URINALYSIS, ROUTINE W REFLEX MICROSCOPIC
Bilirubin, UA: NEGATIVE
Leukocytes,UA: NEGATIVE
Nitrite, UA: NEGATIVE
Protein,UA: NEGATIVE
Specific Gravity, UA: 1.015 (ref 1.005–1.030)
Urobilinogen, Ur: 1 mg/dL (ref 0.2–1.0)
pH, UA: 6.5 (ref 5.0–7.5)

## 2023-07-14 LAB — MICROSCOPIC EXAMINATION: Bacteria, UA: NONE SEEN

## 2023-07-14 LAB — BLADDER SCAN AMB NON-IMAGING: Scan Result: 0

## 2023-07-14 NOTE — Patient Instructions (Addendum)
Overactive bladder (OAB) overview for patients:  Symptoms may include: urinary urgency ("gotta go" feeling) urinary frequency (voiding >8 times per day) night time urination (nocturia) urge incontinence of urine (UUI)  While we do not know the exact etiology of OAB, several treatment options exist including:  Behavioral therapy: Reducing fluid intake Decreasing bladder stimulants (such as caffeine) and irritants (such as acidic food, spicy foods, alcohol) Urge suppression strategies Bladder retraining via timed voiding  Pelvic floor physical therapy  Medication(s) - can use one or both of the drug classes below. Anticholinergic / antimuscarinic medications:  Mechanism of action: Activate M3 receptors to reduce detrusor stimulation and increase bladder capacity  (parasympathetic nervous system). Effect: Relaxes the bladder to decrease overactivity, increase bladder storage capacity, and increase time between voids. Onset: Slow acting (may take 8-12 weeks to determine efficacy). Medications include: Vesicare (Solifenacin), Ditropan (Oxybutynin), Detrol (Tolterodine), Toviaz (Fesoterodine), Sanctura (Trospium), Urispas (Flavoxate), Enablex (Darifenacin), Bentyl (Dicyclomine), Levsin (Hyoscyamine ). Potential side effects include but are not limited to: Dry eyes, dry mouth, constipation, cognitive impairment, dementia risk with long term use, and urinary retention/ incomplete bladder emptying. Insurance companies generally prefer for patients to try 1-2 anticholinergic / antimuscarinic medications first due to low cost. Some exceptions are made based on patient-specific comorbidities / risk factors. Beta-3 agonist medications: Mechanism of action: Stimulates selective B3 adrenergic receptors to cause smooth muscle bladder relaxation (sympathetic nervous system). Effect: Relaxes the bladder to decrease overactivity, increase bladder storage capacity, and increase time between voids. Onset:  Slow acting (may take 8-12 weeks to determine efficacy). Medications include: Myrbetriq (Mirabegron) and Vibegron Laura Suarez). Potential side effects include but are not limited to: urinary retention / incomplete bladder emptying and elevated blood pressure (more likely to occur in individuals with pre-existing uncontrolled hypertension). These medications tend to be more expensive than the anticholinergic / antimuscarinic medications.   For patients with refractory OAB (if the above treatment options have been unsuccessful): Posterior tibial nerve stimulation (PTNS). Small acupuncture-type needle inserted near ankle with electric current to stimulate bladder via posterior tibial nerve pathway. Initially requires 12 weekly in-office treatments lasting 30 minutes each; followed by monthly in-office treatments lasting 30 minutes each for 1 year.  Bladder Botox injections. How it is done: Typically done via in-office cystoscopy; sometimes done in the OR depending on the situation. The bladder is numbed with lidocaine instilled via a catheter. Then the urologist injects Botox into the bladder muscle wall in about 20 locations. Causes local paralysis of the bladder muscle at the injection sites to reduce bladder muscle overactivity / spasms. The effect lasts for approximately 6 months and cannot be reversed once performed. Risks may included but are not limited to: infection, incomplete bladder emptying/ urinary retention, short term need for self-catheterization or indwelling catheter, and need for repeat therapy. There is a 5-12% chance of needing to catheterize with Botox - that usually resolves in a few months as the Botox wears off. Typically Botox injections would need to be repeated every 3-12 months since this is not a permanent therapy.  Sacral neuromodulation trial (Medtronic lnterStim or Axonics implant). Sacral neuromodulation is FDA-approved for uncontrolled urinary urgency, urinary frequency,  urinary urge incontinence, non-obstructive urinary retention, or fecal incontinence. It is not FDA-approved as a treatment for pain. The goal of this therapy is at least a 50% improvement in symptoms. It is NOT realistic to expect a 100% cure. This is a a 2-step outpatient procedure. After a successful test period, a permanent wire and generator are  placed in the OR. We discussed the risk of infection. We reviewed the fact that about 30% of patients fail the test phase and are not candidates for permanent generator placement. During the 1-2 week trial phase, symptoms are documented by the patient to determine response. If patient gets at least a 50% improvement in symptoms, they may then proceed with Step 2. Step 1: Trial lead placement. Per physician discretion, may done one of two ways: Percutaneous nerve evaluation (PNE) in the [facility] urology office. Performed by urologist under local anesthesia (numbing the area with lidocaine) using a spinal needle for placement of test wire, which usually stays in place for 5-7 days to determine therapy response. Test lead placement in OR under anesthesia. Usually stays in place 2 weeks to determine therapy response. > Step 2: Permanent implantation of sacral neuromodulation device, which is performed in the OR.  Sacral neuromodulation implants: All are conditionally MRI safe. Manufacturer: Medtronic Website: [email or web address][email or web address]-[email or web address]-you.[email or web address] Options: lnterStim X: Non-rechargeable. The battery lasts 10 years on average. lnterStim Micro: Rechargeable. The battery lasts 15 years on average and must be charged routinely. Approximately 50% smaller implant than lnterStim X implant.  Manufacturer: Axonics Website: Findrealrelief.[email or web address].com Options: Non-rechargeable (Axonics F15): The battery lasts 15 years on average. Rechargeable (Axonics R20): The battery lasts 20 years on  average and must be charged in office for about 1 hour every 6-10 months on average. Approximately 50% smaller implant than Axonics non-rechargeable implant.  Note: Generally the rechargeable devices are only advised for very small or thin patients who may not have sufficient adipose tissue to comfortably overlay the implanted device.  Suprapubic catheter (SP tube) placement. Only done in severely refractory OAB when all other options have failed or are not a viable treatment choice depending on patient factors. Involves placement of a catheter through the lower abdomen into the bladder to continuously drain the bladder into an external collection bag, which patient can then empty at their convenience every few hours. Done via an outpatient surgical procedure in the OR under anesthesia. Risks may included but are not limited to: surgical site pain, infections, skin irritation / breakdown, chronic bacteriuria, symptomatic UTls. The SP tube must stay in place continuously. This is a reversible procedure however - the insertion site will close if catheter is removed for more than a few hours. The SP tube must be exchanged routinely every 4 weeks to prevent the catheter from becoming clogged with sediment. SP tube exchanges are typically performed at a urology nurse visit or by a home health nurse.    ----------------------------------------------------------------------------------------------------------    Sacral neuromodulation is FDA approved for: Urinary urgency Urinary frequency Urge urinary incontinence Urinary retention Fecal incontinence Sacral neuromodulation is not indicated for pain management. The goal of this therapy is at least a 50% improvement in symptoms. It is NOT realistic to expect a 100% cure. About 70-80% of patients successfully achieve >50% symptomatic improvement and proceed with permanent sacral neuromodulation generator placement. About 20-30% of patients fail the  test phase and are not candidates for permanent sacral neuromodulation generator placement.  After a successful test phase, patient goes to the OR for permanent wire and generator implantation.  Sacral neuromodulation options: Medtronic lnterStim: [employer]fi.html  Axonics: [email or web address].[email or web address]

## 2023-07-20 ENCOUNTER — Other Ambulatory Visit: Payer: Self-pay | Admitting: Urology

## 2023-07-20 DIAGNOSIS — R351 Nocturia: Secondary | ICD-10-CM

## 2023-07-20 DIAGNOSIS — R3915 Urgency of urination: Secondary | ICD-10-CM

## 2023-07-20 DIAGNOSIS — N3941 Urge incontinence: Secondary | ICD-10-CM

## 2023-07-20 DIAGNOSIS — R350 Frequency of micturition: Secondary | ICD-10-CM

## 2023-07-20 DIAGNOSIS — N3281 Overactive bladder: Secondary | ICD-10-CM

## 2023-07-27 NOTE — Patient Instructions (Signed)
Laura Suarez  07/27/2023     @PREFPERIOPPHARMACY @   Your procedure is scheduled on  08/01/2023.   Report to [facility] at  0830 AM.     Call this number if you have problems the morning of surgery:  [phone]  If you experience any cold or flu symptoms such as cough, fever, chills, shortness of breath, etc. between now and your scheduled surgery, please notify us at the above number.   Remember:  Do not eat after midnight.    You may drink clear liquids until 0630 am on 08/01/2023.      Clear liquids allowed are:                    Water, Juice (No red color; non-citric and without pulp; diabetics please choose diet or no sugar options), Carbonated beverages (diabetics please choose diet or no sugar options), Clear Tea (No creamer, milk, or cream, including half & half and powdered creamer), Black Coffee Only (No creamer, milk or cream, including half & half and powdered creamer), and Clear Sports drink (No red color; diabetics please choose diet or no sugar options)      Your last dose of farxiga should be on 07/28/2023.      Take 1/2 of your usual night time insulin the night before your procedure.     DO NOT take any medications for diabetes the morning of your procedure.     Take these medicines the morning of surgery with A SIP OF WATER      buspar, hydroxyzine, omeprazole, paroxetine, risperidone.     Do not wear jewelry, make-up or nail polish, including gel polish,  artificial nails, or any other type of covering on natural nails (fingers and  toes).  Do not wear lotions, powders, or perfumes, or deodorant.  Do not shave 48 hours prior to surgery.  Men may shave face and neck.  Do not bring valuables to the hospital.  [facility] is not responsible for any belongings or valuables.  Contacts, dentures or bridgework may not be worn into surgery.  Leave your suitcase in the car.  After surgery it may be brought to your room.  For patients admitted to  the hospital, discharge time will be determined by your treatment team.  Patients discharged the day of surgery will not be allowed to drive home and must have someone with them for 24 hours.    Special instructions:   DO NOT smoke tobacco or vape for 24 hours before your procedure.  Please read over the following fact sheets that you were given. Coughing and Deep Breathing, Anesthesia Post-op Instructions, and Care and Recovery After Surgery        Sacral Nerve Stimulator Implantation, Care After The following information offers guidance on how to care for yourself after your procedure. Your health care provider may also give you more specific instructions. If you have problems or questions, contact your health care provider. What can I expect after the procedure? After the procedure, it is common to have soreness or pain in the incision area. Follow these instructions at home: Medicines Take over-the-counter and prescription medicines only as told by your health care provider. If you were prescribed an antibiotic medicine, take it as told by your health care provider. Do not stop using the antibiotic even if you start to feel better. Ask your health care provider if the medicine prescribed to you requires you to avoid driving or  using machinery. Incision care     Follow instructions from your health care provider about how to take care of your incisions. Make sure you: Wash your hands with soap and water for at least 20 seconds before and after you change your bandage (dressing). If soap and water are not available, use hand sanitizer. Change your dressing as told by your health care provider. Leave stitches (sutures), staples, skin glue, or adhesive strips in place. These skin closures may need to stay in place for 2 weeks or longer. If adhesive strip edges start to loosen and curl up, you may trim the loose edges. Do not remove adhesive strips completely unless your health care  provider tells you to do that. Check your incision area every day for signs of infection. Check for: Redness, swelling, or more pain. Fluid or blood. Warmth. Pus or a bad smell. Activity Follow instructions from your health care provider about any activity restrictions. You may need to avoid: Bending, twisting, or stretching. Having sex. Do not lift anything that is heavier than 10 lb (4.5 kg), or the limit that you are told, until your health care provider says that it is safe. Return to your normal activities as told by your health care provider. Ask your health care provider what activities are safe for you. Bathing Do not take baths, swim, or use a hot tub until your health care provider approves. Ask your health care provider if you may take showers. You may only be allowed to take sponge baths. Keep the dressing dry until your health care provider says it can be removed. Using the stimulator You will be given a remote control device. This device will allow you to turn your sacral nerve stimulator on and off. Follow instructions from your health care provider about how to use this device. Tell all of your health care providers that you have this device. Remind them that you have the device before they do any tests or procedures. General instructions If you were given a sedative during the procedure, it can affect you for several hours. Do not drive or operate machinery until your health care provider says that it is safe. Keep all follow-up visits. This is important. Your health care provider may need to adjust the stimulator over several visits until it works well for you. Contact a health care provider if: Your device stops working. The device is not helping your symptoms. You have a fever or chills. You have pus or a bad smell coming from an incision. You have redness, swelling, or more pain around an incision. You have fluid or blood coming from an incision. An incision feels  warm to the touch. Summary After the procedure, it is common to have soreness or pain in the incision area. Follow instructions from your health care provider about how to take care of your incision. Also, follow instructions about any activity restrictions. You may need to avoid activities that involve a lot of bending, twisting, or stretching. Tell all of your health care providers that you have this device. Remind them that you have the device before they do any tests or procedures. Contact your health care provider if your device stops working, or if it is not helping to treat your symptoms. Contact your health care provider if you have a fever, or if there is redness, warmth, swelling, pain, pus, or a bad smell in or around an incision. This information is not intended to replace advice given to you by your  health care provider. Make sure you discuss any questions you have with your health care provider. Document Revised: 05/02/2020 Document Reviewed: 05/02/2020 Elsevier Patient Education  2024 [employer] General Anesthesia, Adult, Care After The following information offers guidance on how to care for yourself after your procedure. Your health care provider may also give you more specific instructions. If you have problems or questions, contact your health care provider. What can I expect after the procedure? After the procedure, it is common for people to: Have pain or discomfort at the IV site. Have nausea or vomiting. Have a sore throat or hoarseness. Have trouble concentrating. Feel cold or chills. Feel weak, sleepy, or tired (fatigue). Have soreness and body aches. These can affect parts of the body that were not involved in surgery. Follow these instructions at home: For the time period you were told by your health care provider:  Rest. Do not participate in activities where you could fall or become injured. Do not drive or use machinery. Do not drink alcohol. Do not take  sleeping pills or medicines that cause drowsiness. Do not make important decisions or sign legal documents. Do not take care of children on your own. General instructions Drink enough fluid to keep your urine pale yellow. If you have sleep apnea, surgery and certain medicines can increase your risk for breathing problems. Follow instructions from your health care provider about wearing your sleep device: Anytime you are sleeping, including during daytime naps. While taking prescription pain medicines, sleeping medicines, or medicines that make you drowsy. Return to your normal activities as told by your health care provider. Ask your health care provider what activities are safe for you. Take over-the-counter and prescription medicines only as told by your health care provider. Do not use any products that contain nicotine or tobacco. These products include cigarettes, chewing tobacco, and vaping devices, such as e-cigarettes. These can delay incision healing after surgery. If you need help quitting, ask your health care provider. Contact a health care provider if: You have nausea or vomiting that does not get better with medicine. You vomit every time you eat or drink. You have pain that does not get better with medicine. You cannot urinate or have bloody urine. You develop a skin rash. You have a fever. Get help right away if: You have trouble breathing. You have chest pain. You vomit blood. These symptoms may be an emergency. Get help right away. Call 911. Do not wait to see if the symptoms will go away. Do not drive yourself to the hospital. Summary After the procedure, it is common to have a sore throat, hoarseness, nausea, vomiting, or to feel weak, sleepy, or fatigue. For the time period you were told by your health care provider, do not drive or use machinery. Get help right away if you have difficulty breathing, have chest pain, or vomit blood. These symptoms may be an  emergency. This information is not intended to replace advice given to you by your health care provider. Make sure you discuss any questions you have with your health care provider. Document Revised: 12/25/2021 Document Reviewed: 12/25/2021 Elsevier Patient Education  2024 [employer]. How to Use Chlorhexidine Before Surgery Chlorhexidine gluconate (CHG) is a germ-killing (antiseptic) solution that is used to clean the skin. It can get rid of the bacteria that normally live on the skin and can keep them away for about 24 hours. To clean your skin with CHG, you may be given: A CHG solution to use  in the shower or as part of a sponge bath. A prepackaged cloth that contains CHG. Cleaning your skin with CHG may help lower the risk for infection: While you are staying in the intensive care unit of the hospital. If you have a vascular access, such as a central line, to provide short-term or long-term access to your veins. If you have a catheter to drain urine from your bladder. If you are on a ventilator. A ventilator is a machine that helps you breathe by moving air in and out of your lungs. After surgery. What are the risks? Risks of using CHG include: A skin reaction. Hearing loss, if CHG gets in your ears and you have a perforated eardrum. Eye injury, if CHG gets in your eyes and is not rinsed out. The CHG product catching fire. Make sure that you avoid smoking and flames after applying CHG to your skin. Do not use CHG: If you have a chlorhexidine allergy or have previously reacted to chlorhexidine. On babies younger than 48 months of age. How to use CHG solution Use CHG only as told by your health care provider, and follow the instructions on the label. Use the full amount of CHG as directed. Usually, this is one bottle. During a shower Follow these steps when using CHG solution during a shower (unless your health care provider gives you different instructions): Start the shower. Use  your normal soap and shampoo to wash your face and hair. Turn off the shower or move out of the shower stream. Pour the CHG onto a clean washcloth. Do not use any type of brush or rough-edged sponge. Starting at your neck, lather your body down to your toes. Make sure you follow these instructions: If you will be having surgery, pay special attention to the part of your body where you will be having surgery. Scrub this area for at least 1 minute. Do not use CHG on your head or face. If the solution gets into your ears or eyes, rinse them well with water. Avoid your genital area. Avoid any areas of skin that have broken skin, cuts, or scrapes. Scrub your back and under your arms. Make sure to wash skin folds. Let the lather sit on your skin for 1-2 minutes or as long as told by your health care provider. Thoroughly rinse your entire body in the shower. Make sure that all body creases and crevices are rinsed well. Dry off with a clean towel. Do not put any substances on your body afterward--such as powder, lotion, or perfume--unless you are told to do so by your health care provider. Only use lotions that are recommended by the manufacturer. Put on clean clothes or pajamas. If it is the night before your surgery, sleep in clean sheets.  During a sponge bath Follow these steps when using CHG solution during a sponge bath (unless your health care provider gives you different instructions): Use your normal soap and shampoo to wash your face and hair. Pour the CHG onto a clean washcloth. Starting at your neck, lather your body down to your toes. Make sure you follow these instructions: If you will be having surgery, pay special attention to the part of your body where you will be having surgery. Scrub this area for at least 1 minute. Do not use CHG on your head or face. If the solution gets into your ears or eyes, rinse them well with water. Avoid your genital area. Avoid any areas of skin that have  broken skin, cuts, or scrapes. Scrub your back and under your arms. Make sure to wash skin folds. Let the lather sit on your skin for 1-2 minutes or as long as told by your health care provider. Using a different clean, wet washcloth, thoroughly rinse your entire body. Make sure that all body creases and crevices are rinsed well. Dry off with a clean towel. Do not put any substances on your body afterward--such as powder, lotion, or perfume--unless you are told to do so by your health care provider. Only use lotions that are recommended by the manufacturer. Put on clean clothes or pajamas. If it is the night before your surgery, sleep in clean sheets. How to use CHG prepackaged cloths Only use CHG cloths as told by your health care provider, and follow the instructions on the label. Use the CHG cloth on clean, dry skin. Do not use the CHG cloth on your head or face unless your health care provider tells you to. When washing with the CHG cloth: Avoid your genital area. Avoid any areas of skin that have broken skin, cuts, or scrapes. Before surgery Follow these steps when using a CHG cloth to clean before surgery (unless your health care provider gives you different instructions): Using the CHG cloth, vigorously scrub the part of your body where you will be having surgery. Scrub using a back-and-forth motion for 3 minutes. The area on your body should be completely wet with CHG when you are done scrubbing. Do not rinse. Discard the cloth and let the area air-dry. Do not put any substances on the area afterward, such as powder, lotion, or perfume. Put on clean clothes or pajamas. If it is the night before your surgery, sleep in clean sheets.  For general bathing Follow these steps when using CHG cloths for general bathing (unless your health care provider gives you different instructions). Use a separate CHG cloth for each area of your body. Make sure you wash between any folds of skin and  between your fingers and toes. Wash your body in the following order, switching to a new cloth after each step: The front of your neck, shoulders, and chest. Both of your arms, under your arms, and your hands. Your stomach and groin area, avoiding the genitals. Your right leg and foot. Your left leg and foot. The back of your neck, your back, and your buttocks. Do not rinse. Discard the cloth and let the area air-dry. Do not put any substances on your body afterward--such as powder, lotion, or perfume--unless you are told to do so by your health care provider. Only use lotions that are recommended by the manufacturer. Put on clean clothes or pajamas. Contact a health care provider if: Your skin gets irritated after scrubbing. You have questions about using your solution or cloth. You swallow any chlorhexidine. Call your local poison control center ([phone] in the [place] Get help right away if: Your eyes itch badly, or they become very red or swollen. Your skin itches badly and is red or swollen. Your hearing changes. You have trouble seeing. You have swelling or tingling in your mouth or throat. You have trouble breathing. These symptoms may represent a serious problem that is an emergency. Do not wait to see if the symptoms will go away. Get medical help right away. Call your local emergency services (911 in the [place] Do not drive yourself to the hospital. Summary Chlorhexidine gluconate (CHG) is a germ-killing (antiseptic) solution that is used to clean the  skin. Cleaning your skin with CHG may help to lower your risk for infection. You may be given CHG to use for bathing. It may be in a bottle or in a prepackaged cloth to use on your skin. Carefully follow your health care provider's instructions and the instructions on the product label. Do not use CHG if you have a chlorhexidine allergy. Contact your health care provider if your skin gets irritated after scrubbing. This  information is not intended to replace advice given to you by your health care provider. Make sure you discuss any questions you have with your health care provider. Document Revised: 01/25/2022 Document Reviewed: 12/08/2020 Elsevier Patient Education  2023 [employer].

## 2023-07-28 ENCOUNTER — Encounter (HOSPITAL_COMMUNITY)
Admission: RE | Admit: 2023-07-28 | Discharge: 2023-07-28 | Disposition: A | Discharge status: Home / Self Care | Payer: 59 | Source: Ambulatory Visit | Attending: Urology | Admitting: Urology

## 2023-07-28 DIAGNOSIS — Z794 Long term (current) use of insulin: Secondary | ICD-10-CM

## 2023-07-28 DIAGNOSIS — Z862 Personal history of diseases of the blood and blood-forming organs and certain disorders involving the immune mechanism: Secondary | ICD-10-CM

## 2023-07-29 ENCOUNTER — Encounter (HOSPITAL_COMMUNITY)
Admission: RE | Admit: 2023-07-29 | Discharge: 2023-07-29 | Disposition: A | Discharge status: Home / Self Care | Payer: 59 | Source: Ambulatory Visit | Attending: Urology

## 2023-07-29 ENCOUNTER — Encounter (HOSPITAL_COMMUNITY): Payer: Self-pay

## 2023-07-29 VITALS — BP 126/92 | HR 109 | Temp 97.9°F | Resp 16 | Ht 62.0 in

## 2023-07-29 DIAGNOSIS — Z01810 Encounter for preprocedural cardiovascular examination: Secondary | ICD-10-CM | POA: Diagnosis present

## 2023-07-29 DIAGNOSIS — Z794 Long term (current) use of insulin: Secondary | ICD-10-CM | POA: Diagnosis not present

## 2023-07-29 DIAGNOSIS — E1165 Type 2 diabetes mellitus with hyperglycemia: Secondary | ICD-10-CM | POA: Insufficient documentation

## 2023-07-29 DIAGNOSIS — E876 Hypokalemia: Secondary | ICD-10-CM | POA: Insufficient documentation

## 2023-07-29 DIAGNOSIS — Z01812 Encounter for preprocedural laboratory examination: Secondary | ICD-10-CM | POA: Diagnosis present

## 2023-07-29 DIAGNOSIS — Z01818 Encounter for other preprocedural examination: Secondary | ICD-10-CM | POA: Insufficient documentation

## 2023-07-29 DIAGNOSIS — Z862 Personal history of diseases of the blood and blood-forming organs and certain disorders involving the immune mechanism: Secondary | ICD-10-CM | POA: Diagnosis not present

## 2023-07-29 DIAGNOSIS — R9431 Abnormal electrocardiogram [ECG] [EKG]: Secondary | ICD-10-CM | POA: Insufficient documentation

## 2023-07-29 HISTORY — DX: Bipolar disorder, unspecified: F31.9

## 2023-07-29 LAB — CBC WITH DIFFERENTIAL/PLATELET
Abs Immature Granulocytes: 0.03 10*3/uL (ref 0.00–0.07)
Basophils Absolute: 0 10*3/uL (ref 0.0–0.1)
Basophils Relative: 0 %
Eosinophils Absolute: 0.1 10*3/uL (ref 0.0–0.5)
Eosinophils Relative: 1 %
HCT: 40 % (ref 36.0–46.0)
Hemoglobin: 13.5 g/dL (ref 12.0–15.0)
Immature Granulocytes: 0 %
Lymphocytes Relative: 35 %
Lymphs Abs: 3.5 10*3/uL (ref 0.7–4.0)
MCH: 30.1 pg (ref 26.0–34.0)
MCHC: 33.8 g/dL (ref 30.0–36.0)
MCV: 89.3 fL (ref 80.0–100.0)
Monocytes Absolute: 0.6 10*3/uL (ref 0.1–1.0)
Monocytes Relative: 6 %
Neutro Abs: 5.7 10*3/uL (ref 1.7–7.7)
Neutrophils Relative %: 58 %
Platelets: 382 10*3/uL (ref 150–400)
RBC: 4.48 MIL/uL (ref 3.87–5.11)
RDW: 13.3 % (ref 11.5–15.5)
WBC: 10 10*3/uL (ref 4.0–10.5)
nRBC: 0 % (ref 0.0–0.2)

## 2023-07-29 LAB — BASIC METABOLIC PANEL
Anion gap: 14 (ref 5–15)
BUN: 7 mg/dL (ref 6–20)
CO2: 23 mmol/L (ref 22–32)
Calcium: 8.8 mg/dL — ABNORMAL LOW (ref 8.9–10.3)
Chloride: 99 mmol/L (ref 98–111)
Creatinine, Ser: 0.65 mg/dL (ref 0.44–1.00)
GFR, Estimated: >60 mL/min (ref >60)
Glucose, Bld: 130 mg/dL — ABNORMAL HIGH (ref 70–99)
Potassium: 2.6 mmol/L — CL (ref 3.5–5.1)
Sodium: 136 mmol/L (ref 135–145)

## 2023-07-29 LAB — HEMOGLOBIN A1C
Hgb A1c MFr Bld: 6.3 % — ABNORMAL HIGH (ref 4.8–5.6)
Mean Plasma Glucose: 134.11 mg/dL

## 2023-07-29 NOTE — Progress Notes (Signed)
Critical potassium 2.6 called to Dr Ronne Binning.  Patient is to eat bananas twice a day and pick up Potassium Citate po over the counter and take twice daily.  She was notified and verbalizes understanding.

## 2023-07-31 NOTE — Plan of Care (Signed)
CHL Tonsillectomy/Adenoidectomy, Postoperative PEDS care plan entered in error.

## 2023-08-01 ENCOUNTER — Ambulatory Visit (HOSPITAL_COMMUNITY): Payer: 59 | Admitting: Anesthesiology

## 2023-08-01 ENCOUNTER — Ambulatory Visit (HOSPITAL_COMMUNITY): Payer: 59

## 2023-08-01 ENCOUNTER — Encounter (HOSPITAL_COMMUNITY): Payer: Self-pay | Admitting: Urology

## 2023-08-01 ENCOUNTER — Ambulatory Visit (HOSPITAL_COMMUNITY)
Admission: RE | Admit: 2023-08-01 | Discharge: 2023-08-01 | Disposition: A | Discharge status: Home / Self Care | Payer: 59 | Attending: Urology | Admitting: Urology

## 2023-08-01 ENCOUNTER — Ambulatory Visit (HOSPITAL_BASED_OUTPATIENT_CLINIC_OR_DEPARTMENT_OTHER): Payer: 59 | Admitting: Anesthesiology

## 2023-08-01 ENCOUNTER — Encounter (HOSPITAL_COMMUNITY)
Admission: RE | Disposition: A | Discharge status: Home / Self Care | Payer: Self-pay | Source: Home / Self Care | Attending: Urology

## 2023-08-01 DIAGNOSIS — E119 Type 2 diabetes mellitus without complications: Secondary | ICD-10-CM | POA: Diagnosis not present

## 2023-08-01 DIAGNOSIS — N3281 Overactive bladder: Secondary | ICD-10-CM

## 2023-08-01 DIAGNOSIS — J439 Emphysema, unspecified: Secondary | ICD-10-CM | POA: Diagnosis not present

## 2023-08-01 DIAGNOSIS — K219 Gastro-esophageal reflux disease without esophagitis: Secondary | ICD-10-CM | POA: Insufficient documentation

## 2023-08-01 DIAGNOSIS — Z7984 Long term (current) use of oral hypoglycemic drugs: Secondary | ICD-10-CM | POA: Insufficient documentation

## 2023-08-01 DIAGNOSIS — R911 Solitary pulmonary nodule: Secondary | ICD-10-CM | POA: Diagnosis not present

## 2023-08-01 DIAGNOSIS — R3129 Other microscopic hematuria: Secondary | ICD-10-CM | POA: Diagnosis not present

## 2023-08-01 DIAGNOSIS — Z794 Long term (current) use of insulin: Secondary | ICD-10-CM | POA: Insufficient documentation

## 2023-08-01 DIAGNOSIS — E876 Hypokalemia: Secondary | ICD-10-CM

## 2023-08-01 HISTORY — PX: INTERSTIM IMPLANT PLACEMENT: SHX5130

## 2023-08-01 LAB — POCT I-STAT, CHEM 8
BUN: 4 mg/dL — ABNORMAL LOW (ref 6–20)
Calcium, Ion: 1.08 mmol/L — ABNORMAL LOW (ref 1.15–1.40)
Chloride: 99 mmol/L (ref 98–111)
Creatinine, Ser: 0.6 mg/dL (ref 0.44–1.00)
Glucose, Bld: 88 mg/dL (ref 70–99)
HCT: 40 % (ref 36.0–46.0)
Hemoglobin: 13.6 g/dL (ref 12.0–15.0)
Potassium: 3 mmol/L — ABNORMAL LOW (ref 3.5–5.1)
Sodium: 139 mmol/L (ref 135–145)
TCO2: 26 mmol/L (ref 22–32)

## 2023-08-01 LAB — GLUCOSE, CAPILLARY
Glucose-Capillary: 71 mg/dL (ref 70–99)
Glucose-Capillary: 115 mg/dL — ABNORMAL HIGH (ref 70–99)

## 2023-08-01 SURGERY — INSERTION, SACRAL NERVE STIMULATOR, INTERSTIM, STAGE 1
Anesthesia: General | Site: Back

## 2023-08-01 MED ORDER — OXYCODONE HCL 5 MG/5ML PO SOLN: 5.0000 mg | Freq: Once | ORAL | Status: DC | PRN

## 2023-08-01 MED ORDER — CEFAZOLIN SODIUM-DEXTROSE 2-4 GM/100ML-% IV SOLN
INTRAVENOUS | Status: AC
  Filled 2023-08-01: qty 100

## 2023-08-01 MED ORDER — LACTATED RINGERS IV SOLN: INTRAVENOUS | Status: DC | PRN

## 2023-08-01 MED ORDER — AMOXICILLIN-POT CLAVULANATE 875-125 MG PO TABS: 1.0000 | ORAL_TABLET | Freq: Two times a day (BID) | ORAL | 0 refills | Status: DC

## 2023-08-01 MED ORDER — MIDAZOLAM HCL 2 MG/2ML IJ SOLN
INTRAMUSCULAR | Status: AC
  Filled 2023-08-01: qty 2

## 2023-08-01 MED ORDER — HYDROCODONE-ACETAMINOPHEN 5-325 MG PO TABS: 1.0000 | ORAL_TABLET | Freq: Four times a day (QID) | ORAL | 0 refills | Status: DC | PRN

## 2023-08-01 MED ORDER — MIDAZOLAM HCL 5 MG/5ML IJ SOLN
INTRAMUSCULAR | Status: DC | PRN
  Administered 2023-08-01 (×2): 1 mg via INTRAVENOUS

## 2023-08-01 MED ORDER — CEFAZOLIN SODIUM-DEXTROSE 2-4 GM/100ML-% IV SOLN
2.0000 g | INTRAVENOUS | Status: AC
  Administered 2023-08-01: 2 g via INTRAVENOUS

## 2023-08-01 MED ORDER — DEXMEDETOMIDINE HCL IN NACL 80 MCG/20ML IV SOLN
INTRAVENOUS | Status: DC | PRN
  Administered 2023-08-01: 8 ug via INTRAVENOUS
  Administered 2023-08-01 (×3): 4 ug via INTRAVENOUS

## 2023-08-01 MED ORDER — FENTANYL CITRATE (PF) 100 MCG/2ML IJ SOLN
INTRAMUSCULAR | Status: DC | PRN
  Administered 2023-08-01: 50 ug via INTRAVENOUS
  Administered 2023-08-01 (×2): 25 ug via INTRAVENOUS

## 2023-08-01 MED ORDER — CEFAZOLIN SODIUM-DEXTROSE 2-4 GM/100ML-% IV SOLN: 2.0000 g | INTRAVENOUS | Status: DC

## 2023-08-01 MED ORDER — ONDANSETRON HCL 4 MG/2ML IJ SOLN: 4.0000 mg | Freq: Once | INTRAMUSCULAR | Status: DC | PRN

## 2023-08-01 MED ORDER — FENTANYL CITRATE PF 50 MCG/ML IJ SOSY: 25.0000 ug | PREFILLED_SYRINGE | INTRAMUSCULAR | Status: DC | PRN

## 2023-08-01 MED ORDER — LIDOCAINE HCL (CARDIAC) PF 100 MG/5ML IV SOSY
PREFILLED_SYRINGE | INTRAVENOUS | Status: DC | PRN
  Administered 2023-08-01: 50 mg via INTRAVENOUS

## 2023-08-01 MED ORDER — LIDOCAINE-EPINEPHRINE (PF) 1 %-1:200000 IJ SOLN
INTRAMUSCULAR | Status: AC
Start: 2023-08-01 — End: ?
  Filled 2023-08-01: qty 30

## 2023-08-01 MED ORDER — WATER FOR IRRIGATION, STERILE IR SOLN
Status: DC | PRN
  Administered 2023-08-01: 500 mL

## 2023-08-01 MED ORDER — PROPOFOL 10 MG/ML IV BOLUS
INTRAVENOUS | Status: DC | PRN
  Administered 2023-08-01 (×5): 20 mg via INTRAVENOUS
  Administered 2023-08-01: 10 mg via INTRAVENOUS
  Administered 2023-08-01 (×3): 20 mg via INTRAVENOUS

## 2023-08-01 MED ORDER — FENTANYL CITRATE (PF) 100 MCG/2ML IJ SOLN
INTRAMUSCULAR | Status: AC
  Filled 2023-08-01: qty 2

## 2023-08-01 MED ORDER — PROPOFOL 500 MG/50ML IV EMUL
INTRAVENOUS | Status: DC | PRN
  Administered 2023-08-01: 50 ug/kg/min via INTRAVENOUS

## 2023-08-01 MED ORDER — OXYCODONE HCL 5 MG PO TABS
5.0000 mg | ORAL_TABLET | Freq: Once | ORAL | Status: DC | PRN
Start: 2023-08-01 — End: 2023-08-01

## 2023-08-01 MED ORDER — LIDOCAINE-EPINEPHRINE (PF) 1 %-1:200000 IJ SOLN
INTRAMUSCULAR | Status: DC | PRN
  Administered 2023-08-01: 20 mL

## 2023-08-01 SURGICAL SUPPLY — 48 items
ADH SKN CLS APL DERMABOND .7 (GAUZE/BANDAGES/DRESSINGS) ×1
ANTNA NRSTM XTRN TELEM NS LF (UROLOGICAL SUPPLIES)
APL PRP STRL LF DISP 70% ISPRP (MISCELLANEOUS) ×1
CABLE EXTEN 4.32 INTERSTIM (NEUROSURGERY SUPPLIES) ×1 IMPLANT
CHLORAPREP W/TINT 26 (MISCELLANEOUS) ×1 IMPLANT
COVER LIGHT HANDLE (MISCELLANEOUS) IMPLANT
DERMABOND ADVANCED .7 DNX12 (GAUZE/BANDAGES/DRESSINGS) ×1 IMPLANT
DRAPE C-ARM 42X72 X-RAY (DRAPES) IMPLANT
DRAPE C-ARMOR (DRAPES) ×1 IMPLANT
DRAPE INCISE IOBAN 66X45 STRL (DRAPES) ×1 IMPLANT
DRAPE LAPAROSCOPIC ABDOMINAL (DRAPES) ×1 IMPLANT
DRSG TEGADERM 2-3/8X2-3/4 SM (GAUZE/BANDAGES/DRESSINGS) IMPLANT
DRSG TEGADERM 4X4.75 (GAUZE/BANDAGES/DRESSINGS) ×1 IMPLANT
DRSG TELFA 3X8 NADH (GAUZE/BANDAGES/DRESSINGS)
DRSG TELFA 3X8 NADH STRL (GAUZE/BANDAGES/DRESSINGS) IMPLANT
ELECT REM PT RETURN 9FT ADLT (ELECTROSURGICAL) ×1
ELECTRODE REM PT RTRN 9FT ADLT (ELECTROSURGICAL) ×1 IMPLANT
GAUZE SPONGE 2X2 STRL 8-PLY (GAUZE/BANDAGES/DRESSINGS) IMPLANT
GLOVE BIO SURGEON STRL SZ8 (GLOVE) ×1 IMPLANT
GLOVE BIOGEL PI IND STRL 7.0 (GLOVE) ×2 IMPLANT
GOWN STRL REUS W/TWL LRG LVL3 (GOWN DISPOSABLE) ×1 IMPLANT
GOWN STRL REUS W/TWL XL LVL3 (GOWN DISPOSABLE) ×1 IMPLANT
KIT TURNOVER CYSTO (KITS) ×1 IMPLANT
LEAD INTERSTIM 4.32 28 L (Lead) ×1 IMPLANT
MANIFOLD NEPTUNE II (INSTRUMENTS) IMPLANT
NDL FORAMN 5 20GA (NEUROSURGERY SUPPLIES) IMPLANT
NDL HYPO 21X1.5 SAFETY (NEEDLE) ×1 IMPLANT
NEEDLE FORAMN 5 20GA (NEUROSURGERY SUPPLIES) ×1
NEEDLE HYPO 21X1.5 SAFETY (NEEDLE) ×1
NEUROSTIMULATOR 1.7X2X.06 (UROLOGICAL SUPPLIES) ×1 IMPLANT
PACK MINOR (CUSTOM PROCEDURE TRAY) ×1 IMPLANT
PAD ARMBOARD 7.5X6 YLW CONV (MISCELLANEOUS) ×1 IMPLANT
PAD DRESSING TELFA 3X8 NADH (GAUZE/BANDAGES/DRESSINGS) ×1 IMPLANT
PROGRAMMER ANTENNA EXT (UROLOGICAL SUPPLIES) IMPLANT
PROGRAMMER STIMUL 2.2X1.1X3.7 (UROLOGICAL SUPPLIES) IMPLANT
SET BASIN LINEN APH (SET/KITS/TRAYS/PACK) ×1 IMPLANT
SPONGE GAUZE 2X2 8PLY STRL LF (GAUZE/BANDAGES/DRESSINGS) ×4 IMPLANT
STIMULATOR INTERSTIM 2X1.7X.3 (Miscellaneous) IMPLANT
SUT MNCRL AB 4-0 PS2 18 (SUTURE) ×1 IMPLANT
SUT SILK 2 0 (SUTURE) ×1
SUT SILK 2-0 18XBRD TIE 12 (SUTURE) IMPLANT
SUT SILK 3 0 (SUTURE)
SUT SILK 3-0 FS1 18XBRD (SUTURE) IMPLANT
SUT VIC AB 3-0 SH 27 (SUTURE) ×1
SUT VIC AB 3-0 SH 27X BRD (SUTURE) ×1 IMPLANT
SYR BULB IRRIG 60ML STRL (SYRINGE) ×1 IMPLANT
SYR CONTROL 10ML LL (SYRINGE) ×1 IMPLANT
WATER STERILE IRR 500ML POUR (IV SOLUTION) ×1 IMPLANT

## 2023-08-01 NOTE — Interval H&P Note (Signed)
History and Physical Interval Note:  08/01/2023 11:06 AM  Laura Suarez  has presented today for surgery, with the diagnosis of overactive bladder.  The various methods of treatment have been discussed with the patient and family. After consideration of risks, benefits and other options for treatment, the patient has consented to  Procedure(s): INTERSTIM IMPLANT FIRST STAGE (N/A) as a surgical intervention.  The patient's history has been reviewed, patient examined, no change in status, stable for surgery.  I have reviewed the patient's chart and labs.  Questions were answered to the patient's satisfaction.     Wilkie Aye

## 2023-08-01 NOTE — Anesthesia Procedure Notes (Signed)
Procedure Name: MAC Date/Time: 08/01/2023 11:57 AM  Performed by: Caren Macadam, CRNAPre-anesthesia Checklist: Patient identified, Emergency Drugs available, Suction available, Patient being monitored and Timeout performed Patient Re-evaluated:Patient Re-evaluated prior to induction Oxygen Delivery Method: Nasal cannula Preoxygenation: Pre-oxygenation with 100% oxygen Induction Type: IV induction

## 2023-08-01 NOTE — Anesthesia Postprocedure Evaluation (Signed)
Anesthesia Post Note  Patient: [name]  Procedure(s) Performed: Leane Platt IMPLANT FIRST STAGE (Back)  Patient location during evaluation: PACU Anesthesia Type: General Level of consciousness: awake and alert Pain management: pain level controlled Vital Signs Assessment: post-procedure vital signs reviewed and stable Respiratory status: spontaneous breathing, nonlabored ventilation, respiratory function stable and patient connected to nasal cannula oxygen Cardiovascular status: blood pressure returned to baseline and stable Postop Assessment: no apparent nausea or vomiting Anesthetic complications: no   There were no known notable events for this encounter.   Last Vitals:  Vitals:   08/01/23 1324 08/01/23 1330  BP: (!) 150/93 (!) 161/102  Pulse: 95 95  Resp: (!) 30 14  Temp: 36.5 C (!) 36.3 C  SpO2: 92% 97%    Last Pain:  Vitals:   08/01/23 1330  TempSrc: Oral  PainSc: 0-No pain                 Elnora Quizon L Wafaa Deemer

## 2023-08-01 NOTE — Anesthesia Preprocedure Evaluation (Addendum)
Anesthesia Evaluation  Patient identified by MRN, date of birth, ID band Patient awake    Reviewed: Allergy & Precautions, H&P , NPO status , Patient's Chart, lab work & pertinent test results  Airway Mallampati: III  TM Distance: >3 FB Neck ROM: Full    Dental  (+) Dental Advisory Given, Caps Bridge upper:   Pulmonary  CT - Lungs/Pleura: Central airways are patent mild paraseptal emphysema. No consolidation, pleural effusion or pneumothorax. Stable irregular solid pulmonary nodule of the right lower lobe measuring 7 x 5 mm on series 4, image 80.   Upper Abdomen: Stable left adrenal gland adenoma, no specific follow-up imaging is recommended. No acute abnormality.    Pulmonary exam normal breath sounds clear to auscultation       Cardiovascular Exercise Tolerance: Good Normal cardiovascular exam Rhythm:Regular Rate:Normal     Neuro/Psych  PSYCHIATRIC DISORDERS  Depression Bipolar Disorder   negative neurological ROS     GI/Hepatic Neg liver ROS,GERD  Medicated and Controlled,,  Endo/Other  diabetes, Well Controlled, Type 2, Oral Hypoglycemic Agents, Insulin Dependent    Renal/GU negative Renal ROS  negative genitourinary   Musculoskeletal negative musculoskeletal ROS (+)    Abdominal Normal abdominal exam  (+)   Peds negative pediatric ROS (+)  Hematology negative hematology ROS (+)   Anesthesia Other Findings Left adrenal mass (HCC) Microscopic hematuria Urge incontinence of urine    Reproductive/Obstetrics negative OB ROS                             Anesthesia Physical Anesthesia Plan  ASA: 3  Anesthesia Plan: General   Post-op Pain Management: Minimal or no pain anticipated   Induction: Intravenous  PONV Risk Score and Plan: Propofol infusion  Airway Management Planned: Nasal Cannula and Natural Airway  Additional Equipment: None  Intra-op Plan:   Post-operative  Plan:   Informed Consent: I have reviewed the patients History and Physical, chart, labs and discussed the procedure including the risks, benefits and alternatives for the proposed anesthesia with the patient or authorized representative who has indicated his/her understanding and acceptance.     Dental advisory given  Plan Discussed with: CRNA  Anesthesia Plan Comments:        Anesthesia Quick Evaluation

## 2023-08-01 NOTE — Transfer of Care (Signed)
Immediate Anesthesia Transfer of Care Note  Patient: Ahriana Choe  Procedure(s) Performed: Leane Platt IMPLANT FIRST STAGE (Back)  Patient Location: PACU  Anesthesia Type:MAC  Level of Consciousness: awake and drowsy  Airway & Oxygen Therapy: Patient Spontanous Breathing and Patient connected to nasal cannula oxygen  Post-op Assessment: Report given to RN and Post -op Vital signs reviewed and stable  Post vital signs: Reviewed and stable  Last Vitals:  Vitals Value Taken Time  BP 151/100 08/01/23 1253  Temp    Pulse 103 08/01/23 1256  Resp 23 08/01/23 1256  SpO2 96 % 08/01/23 1256  Vitals shown include unfiled device data.  Last Pain:  Vitals:   08/01/23 0910  TempSrc: Oral  PainSc: 0-No pain      Patients Stated Pain Goal: 8 (08/01/23 0910)  Complications: No notable events documented.

## 2023-08-01 NOTE — Op Note (Signed)
Preoperative diagnosis: Overactive Bladder  Postoperative diagnosis: Same  Procedure: Placement of InterStim stage I and impedance check  Surgeon: Dr. Wilkie Aye  Assistant: None  Antibiotics: Ancef  Drains: None  Indications: The patient is a 57yo with a hx of Overactive bladder. After discussing treatment options she has elected to proceed with Interstim Stage 1 implantation  Procedure in detail: Prior to the procedure consent was obtained. The patient was brought to the operating room and a brief time out was completed to ensure correct patient, correct procedure and correct site. Preoperative antibiotics were given. Extra care was taken positioning the patient in a prone position. Usual skin preparation was utilized.  Using lateral and AP fluoroscopy I marked S3. After several minutes of testing I was in the S4 foramina with a bellows response and the foramina was below the bone knuckle noted on x-ray. Eventually I was in S3 on the patient's left side above the knuckle.  Approximate 12 mL of a lidocaine epinephrine mixture was utilized in the midline. Bony table was anesthetized. The 5 inch foramen needle was introduced into the S3 foramina as noted above. At very low setting she had an excellent bellows and toe response  The inner aspect of the foramen needle was removed and the guide was placed to the appropriate depth removing the framing needle. Under fluoroscopic guidance after making a 1 cm incision the white trocar was passed to the appropriate depth. The lead with a hockey stick bend was placed to the appropriate depth just bridging the bone medially. She had excellent bellows and toe response at all 4 settings.  Under fluoroscopic guidance the inner aspect of the lead was removed. X-rays were taken.  A 2 cm left upper mid buttock incision was made. 10 mL of a lidocaine epinephrine mixture were utilized. It was carried down to appropriate depth and a made an appropriate  sized pocket.  With the passer I passed the lead from medial to lateral and connected. We then passed the lead across the midline and to the right upper buttock. The leas was then brought through this incision. The incisions were then closed with running 3-0 vicryl.  Impedance check was done utilizing sterile technique and was normal in all 4 positions We then placed dermabond on the incisions and this concluded the procedure which was well tolerated by the patient.  Complications: none  Condition: stable, transferred to PACU  Plan: The patient is to be discharged home after she voids in the PACU. If she cannot void the foley will be replaced and she will followup in 2-3 days for a second voiding trial. If she is able to void she will keep a voiding diary and will be reassessed in 1 week for response.Preoperative diagnosis: Unobstructed Urinary retnetion

## 2023-08-02 ENCOUNTER — Encounter (HOSPITAL_COMMUNITY): Payer: Self-pay | Admitting: Urology

## 2023-08-03 NOTE — Progress Notes (Signed)
Name: Laura Suarez DOB: Jun 06, 1966 MRN: [id number]  Diagnoses: Post-operative state  HPI: Laura Suarez presents post-operatively s/p the following procedure by Dr. Ronne Binning on 08/01/2023:  Preoperative diagnosis: Overactive Bladder   Postoperative diagnosis: Same   Procedure: Placement of InterStim stage I and impedance check  Postop course: Today She reports tenderness at the incision site(s); denies redness, warmth, swelling, or drainage. She denies fevers.  She reports some symptomatic improvement since the procedure. She reports  decreased  urinary frequency, nocturia, urgency, and urge incontinence. Voiding 7-8x/day and 1x/night on average. Continues leaking several times throughout the day and has to change her clothes about 3-4x/day on average due to leakage; denies using pads or diapers at this time per her preference. She has worked on decreasing her caffeine intake - no longer drinking tea all day, now drinking more water.  She denies dysuria, gross hematuria, straining to void, or sensations of incomplete emptying.   Fall Screening: Do you usually have a device to assist in your mobility? No   Medications: Current Outpatient Medications  Medication Sig Dispense Refill   amoxicillin-clavulanate (AUGMENTIN) 875-125 MG tablet Take 1 tablet by mouth 2 (two) times daily. 14 tablet 0   Apoaequorin (PREVAGEN PO) Take 1 capsule by mouth daily.     aspirin 81 MG EC tablet Take 81 mg by mouth daily.     atorvastatin (LIPITOR) 40 MG tablet Take 40 mg by mouth at bedtime.     Biotin 5000 MCG CAPS Take 5,000 mcg by mouth daily.     busPIRone (BUSPAR) 10 MG tablet Take 10 mg by mouth 3 (three) times daily.     Coenzyme Q10 100 MG CHEW Chew 100 mg by mouth daily.     FARXIGA 5 MG TABS tablet Take 5 mg by mouth every morning.     ferrous sulfate 324 MG TBEC Take 324 mg by mouth 4 (four) times a week.     HYDROcodone-acetaminophen (NORCO) 5-325 MG tablet Take 1 tablet by mouth  every 6 (six) hours as needed for moderate pain (pain score 4-6). 30 tablet 0   hydrOXYzine (ATARAX) 25 MG tablet Take 25 mg by mouth daily as needed for anxiety.     lisinopril (ZESTRIL) 2.5 MG tablet Take 2.5 mg by mouth every morning.     metFORMIN (GLUCOPHAGE) 1000 MG tablet Take 1,000 mg by mouth 2 (two) times daily.     Multiple Vitamin (MULTIVITAMIN WITH MINERALS) TABS tablet Take 1 tablet by mouth daily.     omeprazole (PRILOSEC) 40 MG capsule Take 40 mg by mouth at bedtime.     OXcarbazepine (TRILEPTAL) 150 MG tablet Take 300 mg by mouth at bedtime.     PARoxetine (PAXIL) 40 MG tablet Take 40 mg by mouth daily.     Probiotic Product (PROBIOTIC PO) Take 1 tablet by mouth 3 (three) times a week.     risperiDONE (RISPERDAL) 1 MG tablet Take 1-2 mg by mouth See admin instructions. Take 1 mg in the morning and 2 mg in the evening     traZODone (DESYREL) 150 MG tablet Take 150 mg by mouth at bedtime.     TRESIBA FLEXTOUCH 200 UNIT/ML FlexTouch Pen Inject 26 Units into the skin every evening.     No current facility-administered medications for this visit.    Allergies: Allergies  Allergen Reactions   Sulfa Antibiotics Rash   Ziprasidone Hcl Other (See Comments)    Unable to move limbs      Past Medical  History:  Diagnosis Date   Bipolar disorder (HCC)    Depression    Diabetes mellitus without complication (HCC)    High cholesterol    Past Surgical History:  Procedure Laterality Date   ABDOMINAL HYSTERECTOMY     BIOPSY  05/16/2023   Procedure: BIOPSY;  Surgeon: Franky Macho, MD;  Location: [facility];  Service: Endoscopy;;   COLONOSCOPY WITH PROPOFOL N/A 05/16/2023   Procedure: COLONOSCOPY WITH PROPOFOL;  Surgeon: Franky Macho, MD;  Location: [facility];  Service: Endoscopy;  Laterality: N/A;  7:30AM;ASA 2   INTERSTIM IMPLANT PLACEMENT N/A 08/01/2023   Procedure: Leane Platt IMPLANT FIRST STAGE;  Surgeon: Malen Gauze, MD;  Location: [facility];  Service:  Urology;  Laterality: N/A;   History reviewed. No pertinent family history. Social History   Socioeconomic History   Marital status: Married    Spouse name: Not on file   Number of children: Not on file   Years of education: Not on file   Highest education level: Not on file  Occupational History   Not on file  Tobacco Use   Smoking status: Never   Smokeless tobacco: Never  Vaping Use   Vaping status: Never Used  Substance and Sexual Activity   Alcohol use: Not on file   Drug use: Never   Sexual activity: Not on file  Other Topics Concern   Not on file  Social History Narrative   Not on file   Social Determinants of Health   Financial Resource Strain: Low Risk  (01/06/2022)   Received from [facility], [facility]   Overall Financial Resource Strain (CARDIA)    Difficulty of Paying Living Expenses: Not hard at all  Food Insecurity: No Food Insecurity (06/03/2021)   Received from [facility], [facility]   Hunger Vital Sign    Worried About Running Out of Food in the Last Year: Never true    Ran Out of Food in the Last Year: Never true  Transportation Needs: No Transportation Needs (01/06/2022)   Received from [facility], [facility]   [facility] - Transportation    Lack of Transportation (Medical): No    Lack of Transportation (Non-Medical): No  Physical Activity: Insufficiently Active (06/03/2021)   Received from [facility], [facility]   Exercise Vital Sign    Days of Exercise per Week: 4 days    Minutes of Exercise per Session: 30 min  Stress: No Stress Concern Present (06/03/2021)   Received from [facility], [facility] of Occupational Health - Occupational Stress Questionnaire    Feeling of Stress : Not at all  Social Connections: Socially Integrated (06/03/2021)   Received from [facility], [facility]   Social Connection and Isolation Panel [NHANES]    Frequency of Communication with Friends  and Family: Twice a week    Frequency of Social Gatherings with Friends and Family: Twice a week    Attends Religious Services: 1 to 4 times per year    Active Member of [employer] or Organizations: No    Attends Engineer, structural: 1 to 4 times per year    Marital Status: Married  Catering manager Violence: Not At Risk (06/03/2021)   Received from [facility], [facility]   Humiliation, Afraid, Rape, and Kick questionnaire    Fear of Current or Ex-Partner: No    Emotionally Abused: No  Physically Abused: No    Sexually Abused: No    SUBJECTIVE  Review of Systems Constitutional: Patient denies any unintentional weight loss or change in strength lntegumentary: Patient denies any rashes or pruritus Cardiovascular: Patient denies chest pain or syncope Respiratory: Patient denies shortness of breath Gastrointestinal: Patient denies nausea, vomiting, constipation, or diarrhea Musculoskeletal: Patient denies muscle cramps or weakness Neurologic: Patient denies convulsions or seizures Allergic/Immunologic: Patient denies recent allergic reaction(s) Hematologic/Lymphatic: Patient denies bleeding tendencies Endocrine: Patient denies heat/cold intolerance  GU: As per HPI.  OBJECTIVE Vitals:   08/10/23 1337  BP: 99/69  Pulse: (!) 126  Temp: 98.8 F (37.1 C)   Body mass index is 22.86 kg/m.  Physical Examination Constitutional: No obvious distress; patient is non-toxic appearing  Cardiovascular: No visible lower extremity edema.  Respiratory: The patient does not have audible wheezing/stridor; respirations do not appear labored  Gastrointestinal: Abdomen non-distended Musculoskeletal: Normal ROM of UEs  Neurologic: CN 2-12 grossly intact Psychiatric: Answered questions appropriately with normal affect  Hematologic/Lymphatic/Immunologic: No obvious bruises or sites of spontaneous bleeding  GU: Incision site(s) well healed, intact, with no surrounding erythema,  edema, crepitus, fluctuance, drainage / discharge, warmth, significant tenderness to palpation.   UA: 3-10 RBC/hpf, no evidence of UTI  PVR: 0 ml  ASSESSMENT OAB (overactive bladder) - Plan: Urinalysis, Routine w reflex microscopic, BLADDER SCAN AMB NON-IMAGING  Urge incontinence of urine  Postop check  We reviewed the operative procedures and findings. Pre-operative symptoms are improved since the procedure. Surgical site healing well.  She elected to proceed with Stage 2 as scheduled on 08/25/2023.  Pt verbalized understanding and agreement. All questions were answered.  PLAN Advised the following: OK to proceed with Stage 2 as scheduled on 08/25/2023.  Orders Placed This Encounter  Procedures   Urinalysis, Routine w reflex microscopic   BLADDER SCAN AMB NON-IMAGING    It has been explained that the patient is to follow regularly with their PCP in addition to all other providers involved in their care and to follow instructions provided by these respective offices. Patient advised to contact urology clinic if any urologic-pertaining questions, concerns, new symptoms or problems arise in the interim period.  There are no Patient Instructions on file for this visit.  Electronically signed by:  Donnita Falls, MSN, FNP-C, CUNP 08/10/2023 2:13 PM

## 2023-08-10 ENCOUNTER — Encounter: Payer: Self-pay | Admitting: Urology

## 2023-08-10 ENCOUNTER — Ambulatory Visit (INDEPENDENT_AMBULATORY_CARE_PROVIDER_SITE_OTHER): Payer: 59 | Admitting: Urology

## 2023-08-10 VITALS — BP 99/69 | HR 126 | Temp 98.8°F | Ht 62.0 in | Wt 125.0 lb

## 2023-08-10 DIAGNOSIS — N3281 Overactive bladder: Secondary | ICD-10-CM | POA: Diagnosis not present

## 2023-08-10 DIAGNOSIS — N3941 Urge incontinence: Secondary | ICD-10-CM | POA: Diagnosis not present

## 2023-08-10 DIAGNOSIS — Z09 Encounter for follow-up examination after completed treatment for conditions other than malignant neoplasm: Secondary | ICD-10-CM

## 2023-08-10 LAB — URINALYSIS, ROUTINE W REFLEX MICROSCOPIC
Bilirubin, UA: NEGATIVE
Ketones, UA: NEGATIVE
Leukocytes,UA: NEGATIVE
Nitrite, UA: NEGATIVE
Protein,UA: NEGATIVE
Specific Gravity, UA: <1.005 — ABNORMAL LOW (ref 1.005–1.030)
Urobilinogen, Ur: 0.2 mg/dL (ref 0.2–1.0)
pH, UA: 6.5 (ref 5.0–7.5)

## 2023-08-10 LAB — MICROSCOPIC EXAMINATION
Bacteria, UA: NONE SEEN
WBC, UA: NONE SEEN /[HPF] (ref 0–5)

## 2023-08-10 LAB — BLADDER SCAN AMB NON-IMAGING: Scan Result: 0

## 2023-08-10 NOTE — Progress Notes (Signed)
post void residual=0 ?

## 2023-08-11 DIAGNOSIS — Z299 Encounter for prophylactic measures, unspecified: Secondary | ICD-10-CM | POA: Diagnosis not present

## 2023-08-11 DIAGNOSIS — Z6822 Body mass index (BMI) 22.0-22.9, adult: Secondary | ICD-10-CM | POA: Diagnosis not present

## 2023-08-11 DIAGNOSIS — E1165 Type 2 diabetes mellitus with hyperglycemia: Secondary | ICD-10-CM | POA: Diagnosis not present

## 2023-08-16 DIAGNOSIS — M79676 Pain in unspecified toe(s): Secondary | ICD-10-CM | POA: Diagnosis not present

## 2023-08-16 DIAGNOSIS — B351 Tinea unguium: Secondary | ICD-10-CM | POA: Diagnosis not present

## 2023-08-16 DIAGNOSIS — E1142 Type 2 diabetes mellitus with diabetic polyneuropathy: Secondary | ICD-10-CM | POA: Diagnosis not present

## 2023-08-16 DIAGNOSIS — L84 Corns and callosities: Secondary | ICD-10-CM | POA: Diagnosis not present

## 2023-08-23 ENCOUNTER — Other Ambulatory Visit (HOSPITAL_BASED_OUTPATIENT_CLINIC_OR_DEPARTMENT_OTHER): Payer: 59

## 2023-08-23 ENCOUNTER — Ambulatory Visit (HOSPITAL_BASED_OUTPATIENT_CLINIC_OR_DEPARTMENT_OTHER): Payer: 59 | Attending: Internal Medicine

## 2023-08-23 ENCOUNTER — Encounter (HOSPITAL_COMMUNITY)
Admission: RE | Admit: 2023-08-23 | Discharge: 2023-08-23 | Disposition: A | Discharge status: Home / Self Care | Payer: 59 | Source: Ambulatory Visit | Attending: Urology | Admitting: Urology

## 2023-08-23 ENCOUNTER — Encounter (HOSPITAL_COMMUNITY): Payer: Self-pay

## 2023-08-23 ENCOUNTER — Other Ambulatory Visit: Payer: Self-pay

## 2023-08-23 VITALS — Ht 62.0 in | Wt 125.0 lb

## 2023-08-23 DIAGNOSIS — E876 Hypokalemia: Secondary | ICD-10-CM

## 2023-08-25 ENCOUNTER — Encounter (HOSPITAL_COMMUNITY)
Admission: RE | Disposition: A | Discharge status: Home / Self Care | Payer: Self-pay | Source: Home / Self Care | Attending: Urology

## 2023-08-25 ENCOUNTER — Ambulatory Visit (HOSPITAL_COMMUNITY)
Admission: RE | Admit: 2023-08-25 | Discharge: 2023-08-25 | Disposition: A | Discharge status: Home / Self Care | Payer: 59 | Attending: Urology | Admitting: Urology

## 2023-08-25 ENCOUNTER — Encounter (HOSPITAL_COMMUNITY): Payer: Self-pay | Admitting: Urology

## 2023-08-25 ENCOUNTER — Ambulatory Visit (HOSPITAL_COMMUNITY): Payer: 59 | Admitting: Certified Registered"

## 2023-08-25 ENCOUNTER — Other Ambulatory Visit: Payer: Self-pay

## 2023-08-25 DIAGNOSIS — E876 Hypokalemia: Secondary | ICD-10-CM

## 2023-08-25 DIAGNOSIS — Z538 Procedure and treatment not carried out for other reasons: Secondary | ICD-10-CM | POA: Insufficient documentation

## 2023-08-25 DIAGNOSIS — Z794 Long term (current) use of insulin: Secondary | ICD-10-CM | POA: Insufficient documentation

## 2023-08-25 DIAGNOSIS — Z7984 Long term (current) use of oral hypoglycemic drugs: Secondary | ICD-10-CM | POA: Insufficient documentation

## 2023-08-25 DIAGNOSIS — E119 Type 2 diabetes mellitus without complications: Secondary | ICD-10-CM | POA: Insufficient documentation

## 2023-08-25 DIAGNOSIS — N3281 Overactive bladder: Secondary | ICD-10-CM | POA: Insufficient documentation

## 2023-08-25 LAB — POCT I-STAT, CHEM 8
BUN: <3 mg/dL — ABNORMAL LOW (ref 6–20)
Calcium, Ion: 1.1 mmol/L — ABNORMAL LOW (ref 1.15–1.40)
Chloride: 98 mmol/L (ref 98–111)
Creatinine, Ser: 0.6 mg/dL (ref 0.44–1.00)
Glucose, Bld: 94 mg/dL (ref 70–99)
HCT: 42 % (ref 36.0–46.0)
Hemoglobin: 14.3 g/dL (ref 12.0–15.0)
Potassium: 2.5 mmol/L — CL (ref 3.5–5.1)
Sodium: 138 mmol/L (ref 135–145)
TCO2: 23 mmol/L (ref 22–32)

## 2023-08-25 LAB — POTASSIUM: Potassium: 2.4 mmol/L — CL (ref 3.5–5.1)

## 2023-08-25 SURGERY — INTERSTIM IMPLANT SECOND STAGE
Anesthesia: General

## 2023-08-25 MED ORDER — CHLORHEXIDINE GLUCONATE 0.12 % MT SOLN: 15.0000 mL | Freq: Once | OROMUCOSAL | Status: DC

## 2023-08-25 MED ORDER — CEFAZOLIN SODIUM-DEXTROSE 2-4 GM/100ML-% IV SOLN
INTRAVENOUS | Status: AC
  Filled 2023-08-25: qty 100

## 2023-08-25 MED ORDER — PROPOFOL 10 MG/ML IV BOLUS
INTRAVENOUS | Status: AC
  Filled 2023-08-25: qty 20

## 2023-08-25 MED ORDER — FENTANYL CITRATE (PF) 100 MCG/2ML IJ SOLN
INTRAMUSCULAR | Status: AC
  Filled 2023-08-25: qty 2

## 2023-08-25 MED ORDER — ORAL CARE MOUTH RINSE: 15.0000 mL | Freq: Once | OROMUCOSAL | Status: DC

## 2023-08-25 MED ORDER — PROPOFOL 500 MG/50ML IV EMUL
INTRAVENOUS | Status: AC
  Filled 2023-08-25: qty 50

## 2023-08-25 MED ORDER — ONDANSETRON HCL 4 MG/2ML IJ SOLN
INTRAMUSCULAR | Status: AC
Start: 2023-08-25 — End: ?
  Filled 2023-08-25: qty 2

## 2023-08-25 MED ORDER — LIDOCAINE HCL (PF) 2 % IJ SOLN
INTRAMUSCULAR | Status: AC
  Filled 2023-08-25: qty 5

## 2023-08-25 MED ORDER — CEFAZOLIN SODIUM-DEXTROSE 2-4 GM/100ML-% IV SOLN: 2.0000 g | INTRAVENOUS | Status: DC

## 2023-08-25 MED ORDER — DEXAMETHASONE SODIUM PHOSPHATE 10 MG/ML IJ SOLN
INTRAMUSCULAR | Status: AC
  Filled 2023-08-25: qty 1

## 2023-08-25 MED ORDER — MIDAZOLAM HCL 2 MG/2ML IJ SOLN
INTRAMUSCULAR | Status: AC
  Filled 2023-08-25: qty 2

## 2023-08-25 NOTE — Anesthesia Preprocedure Evaluation (Addendum)
Anesthesia Evaluation  Patient identified by MRN, date of birth, ID band Patient awake    Reviewed: Allergy & Precautions, H&P , NPO status , Patient's Chart, lab work & pertinent test results  Airway Mallampati: III  TM Distance: >3 FB Neck ROM: Full    Dental  (+) Dental Advisory Given, [place] upper:   Pulmonary  CT - Lungs/Pleura: Central airways are patent mild paraseptal emphysema. No consolidation, pleural effusion or pneumothorax. Stable irregular solid pulmonary nodule of the right lower lobe measuring 7 x 5 mm on series 4, image 80.   Upper Abdomen: Stable left adrenal gland adenoma, no specific follow-up imaging is recommended. No acute abnormality.    Pulmonary exam normal breath sounds clear to auscultation       Cardiovascular Exercise Tolerance: Good Normal cardiovascular exam Rhythm:Regular Rate:Normal     Neuro/Psych  PSYCHIATRIC DISORDERS  Depression Bipolar Disorder   negative neurological ROS     GI/Hepatic Neg liver ROS,GERD  Medicated and Controlled,,  Endo/Other  diabetes, Well Controlled, Type 2, Oral Hypoglycemic Agents, Insulin Dependent    Renal/GU negative Renal ROS  negative genitourinary   Musculoskeletal negative musculoskeletal ROS (+)    Abdominal Normal abdominal exam  (+)   Peds negative pediatric ROS (+)  Hematology negative hematology ROS (+)   Anesthesia Other Findings Left adrenal mass (HCC) Microscopic hematuria Urge incontinence of urine  Potasium is 2.4 today.  Will delay elective surgery until K is at least over 3    Reproductive/Obstetrics negative OB ROS                             Anesthesia Physical Anesthesia Plan  ASA: 3  Anesthesia Plan: General   Post-op Pain Management: Minimal or no pain anticipated   Induction: Intravenous  PONV Risk Score and Plan: Propofol infusion  Airway Management Planned: Nasal Cannula and  Natural Airway  Additional Equipment: None  Intra-op Plan:   Post-operative Plan:   Informed Consent: I have reviewed the patients History and Physical, chart, labs and discussed the procedure including the risks, benefits and alternatives for the proposed anesthesia with the patient or authorized representative who has indicated his/her understanding and acceptance.     Dental advisory given  Plan Discussed with: CRNA  Anesthesia Plan Comments:        Anesthesia Quick Evaluation

## 2023-08-25 NOTE — OR Nursing (Signed)
Potassium from lab was reported be 2.4 .  , Dr. Leta Jungling notified. He is consulting with Dr. Ronne Binning. Aram Beecham in OR notified.

## 2023-08-25 NOTE — OR Nursing (Signed)
Procedure cancelled and heplock removed. Patient request to leave. Dr. Ronne Binning notified.

## 2023-08-25 NOTE — OR Nursing (Signed)
Patient aware to start taking her potassium supplement and Dr. Ronne Binning is also calling her a potassium supplement in to her drug store.

## 2023-08-30 ENCOUNTER — Telehealth: Payer: Self-pay

## 2023-08-30 NOTE — Telephone Encounter (Signed)
Email sent to rep to confirm availability for 12/19- pending approval with rep.

## 2023-08-30 NOTE — Telephone Encounter (Signed)
Authorization is good until 11/20/2023 per last approval.

## 2023-08-30 NOTE — Telephone Encounter (Signed)
Patient called asking if there is a potential surgery date for her rescheduled interstim procedure.  She states she has not been able to shower since having her first surgery and would like to have this done sooner than later.  Would 12/16 be an option for her?  Also will MD need to submitt new surgery request?

## 2023-08-30 NOTE — Telephone Encounter (Signed)
We can do 12/19. We should be able to use the existing surgery referral.  We will need to verify auth dates.

## 2023-09-01 DIAGNOSIS — J410 Simple chronic bronchitis: Secondary | ICD-10-CM | POA: Diagnosis not present

## 2023-09-01 DIAGNOSIS — K219 Gastro-esophageal reflux disease without esophagitis: Secondary | ICD-10-CM | POA: Diagnosis not present

## 2023-09-02 ENCOUNTER — Encounter: Payer: 59 | Admitting: Urology

## 2023-09-02 ENCOUNTER — Ambulatory Visit: Payer: 59 | Admitting: Internal Medicine

## 2023-09-05 NOTE — Patient Instructions (Signed)
Laura Suarez  09/05/2023     @PREFPERIOPPHARMACY @   Your procedure is scheduled on  09/13/2023.   Report to [facility] at  1030 A.M.   Call this number if you have problems the morning of surgery:  [phone]  If you experience any cold or flu symptoms such as cough, fever, chills, shortness of breath, etc. between now and your scheduled surgery, please notify us at the above number.   Remember:      Take 1/2 ( 13 units of your night time tresiba the night before your procedure.      DO NOT take any medications for diabetes the morning of your procedure.    Do not eat after midnight.   You may drink clear liquids until 0830 am on 09/13/2023.   Clear liquids allowed are:                    Water, Juice (No red color; non-citric and without pulp; diabetics please choose diet or no sugar options), Carbonated beverages (diabetics please choose diet or no sugar options), Clear Tea (No creamer, milk, or cream, including half & half and powdered creamer), Black Coffee Only (No creamer, milk or cream, including half & half and powdered creamer), and Clear Sports drink (No red color; diabetics please choose diet or no sugar options)    Take these medicines the morning of surgery with A SIP OF WATER      buspirone, hydroxyzine(if needed), paroxetine, risperidone.     Do not wear jewelry, make-up or nail polish, including gel polish,  artificial nails, or any other type of covering on natural nails (fingers and  toes).  Do not wear lotions, powders, or perfumes, or deodorant.  Do not shave 48 hours prior to surgery.  Men may shave face and neck.  Do not bring valuables to the hospital.  [facility] is not responsible for any belongings or valuables.  Contacts, dentures or bridgework may not be worn into surgery.  Leave your suitcase in the car.  After surgery it may be brought to your room.  For patients admitted to the hospital, discharge time will be determined by your  treatment team.  Patients discharged the day of surgery will not be allowed to drive home and must have someone with them for 24 hours.    Special instructions:   DO NOT smoke tobacco or vape for 24 hours before your procedure.  Please read over the following fact sheets that you were given. Anesthesia Post-op Instructions and Care and Recovery After Surgery         Sacral Nerve Stimulator Implantation, Care After The following information offers guidance on how to care for yourself after your procedure. Your health care provider may also give you more specific instructions. If you have problems or questions, contact your health care provider. What can I expect after the procedure? After the procedure, it is common to have soreness or pain in the incision area. Follow these instructions at home: Medicines Take over-the-counter and prescription medicines only as told by your health care provider. If you were prescribed an antibiotic medicine, take it as told by your health care provider. Do not stop using the antibiotic even if you start to feel better. Ask your health care provider if the medicine prescribed to you requires you to avoid driving or using machinery. Incision care     Follow instructions from your health care provider about how to take  care of your incisions. Make sure you: Wash your hands with soap and water for at least 20 seconds before and after you change your bandage (dressing). If soap and water are not available, use hand sanitizer. Change your dressing as told by your health care provider. Leave stitches (sutures), staples, skin glue, or adhesive strips in place. These skin closures may need to stay in place for 2 weeks or longer. If adhesive strip edges start to loosen and curl up, you may trim the loose edges. Do not remove adhesive strips completely unless your health care provider tells you to do that. Check your incision area every day for signs of  infection. Check for: Redness, swelling, or more pain. Fluid or blood. Warmth. Pus or a bad smell. Activity Follow instructions from your health care provider about any activity restrictions. You may need to avoid: Bending, twisting, or stretching. Having sex. Do not lift anything that is heavier than 10 lb (4.5 kg), or the limit that you are told, until your health care provider says that it is safe. Return to your normal activities as told by your health care provider. Ask your health care provider what activities are safe for you. Bathing Do not take baths, swim, or use a hot tub until your health care provider approves. Ask your health care provider if you may take showers. You may only be allowed to take sponge baths. Keep the dressing dry until your health care provider says it can be removed. Using the stimulator You will be given a remote control device. This device will allow you to turn your sacral nerve stimulator on and off. Follow instructions from your health care provider about how to use this device. Tell all of your health care providers that you have this device. Remind them that you have the device before they do any tests or procedures. General instructions If you were given a sedative during the procedure, it can affect you for several hours. Do not drive or operate machinery until your health care provider says that it is safe. Keep all follow-up visits. This is important. Your health care provider may need to adjust the stimulator over several visits until it works well for you. Contact a health care provider if: Your device stops working. The device is not helping your symptoms. You have a fever or chills. You have pus or a bad smell coming from an incision. You have redness, swelling, or more pain around an incision. You have fluid or blood coming from an incision. An incision feels warm to the touch. Summary After the procedure, it is common to have soreness or  pain in the incision area. Follow instructions from your health care provider about how to take care of your incision. Also, follow instructions about any activity restrictions. You may need to avoid activities that involve a lot of bending, twisting, or stretching. Tell all of your health care providers that you have this device. Remind them that you have the device before they do any tests or procedures. Contact your health care provider if your device stops working, or if it is not helping to treat your symptoms. Contact your health care provider if you have a fever, or if there is redness, warmth, swelling, pain, pus, or a bad smell in or around an incision. This information is not intended to replace advice given to you by your health care provider. Make sure you discuss any questions you have with your health care provider. Document Revised: 05/02/2020  Document Reviewed: 05/02/2020 Elsevier Patient Education  2024 [employer] General Anesthesia, Adult, Care After The following information offers guidance on how to care for yourself after your procedure. Your health care provider may also give you more specific instructions. If you have problems or questions, contact your health care provider. What can I expect after the procedure? After the procedure, it is common for people to: Have pain or discomfort at the IV site. Have nausea or vomiting. Have a sore throat or hoarseness. Have trouble concentrating. Feel cold or chills. Feel weak, sleepy, or tired (fatigue). Have soreness and body aches. These can affect parts of the body that were not involved in surgery. Follow these instructions at home: For the time period you were told by your health care provider:  Rest. Do not participate in activities where you could fall or become injured. Do not drive or use machinery. Do not drink alcohol. Do not take sleeping pills or medicines that cause drowsiness. Do not make important decisions  or sign legal documents. Do not take care of children on your own. General instructions Drink enough fluid to keep your urine pale yellow. If you have sleep apnea, surgery and certain medicines can increase your risk for breathing problems. Follow instructions from your health care provider about wearing your sleep device: Anytime you are sleeping, including during daytime naps. While taking prescription pain medicines, sleeping medicines, or medicines that make you drowsy. Return to your normal activities as told by your health care provider. Ask your health care provider what activities are safe for you. Take over-the-counter and prescription medicines only as told by your health care provider. Do not use any products that contain nicotine or tobacco. These products include cigarettes, chewing tobacco, and vaping devices, such as e-cigarettes. These can delay incision healing after surgery. If you need help quitting, ask your health care provider. Contact a health care provider if: You have nausea or vomiting that does not get better with medicine. You vomit every time you eat or drink. You have pain that does not get better with medicine. You cannot urinate or have bloody urine. You develop a skin rash. You have a fever. Get help right away if: You have trouble breathing. You have chest pain. You vomit blood. These symptoms may be an emergency. Get help right away. Call 911. Do not wait to see if the symptoms will go away. Do not drive yourself to the hospital. Summary After the procedure, it is common to have a sore throat, hoarseness, nausea, vomiting, or to feel weak, sleepy, or fatigue. For the time period you were told by your health care provider, do not drive or use machinery. Get help right away if you have difficulty breathing, have chest pain, or vomit blood. These symptoms may be an emergency. This information is not intended to replace advice given to you by your health  care provider. Make sure you discuss any questions you have with your health care provider. Document Revised: 12/25/2021 Document Reviewed: 12/25/2021 Elsevier Patient Education  2024 [employer]. How to Use Chlorhexidine Before Surgery Chlorhexidine gluconate (CHG) is a germ-killing (antiseptic) solution that is used to clean the skin. It can get rid of the bacteria that normally live on the skin and can keep them away for about 24 hours. To clean your skin with CHG, you may be given: A CHG solution to use in the shower or as part of a sponge bath. A prepackaged cloth that contains CHG. Cleaning your skin  with CHG may help lower the risk for infection: While you are staying in the intensive care unit of the hospital. If you have a vascular access, such as a central line, to provide short-term or long-term access to your veins. If you have a catheter to drain urine from your bladder. If you are on a ventilator. A ventilator is a machine that helps you breathe by moving air in and out of your lungs. After surgery. What are the risks? Risks of using CHG include: A skin reaction. Hearing loss, if CHG gets in your ears and you have a perforated eardrum. Eye injury, if CHG gets in your eyes and is not rinsed out. The CHG product catching fire. Make sure that you avoid smoking and flames after applying CHG to your skin. Do not use CHG: If you have a chlorhexidine allergy or have previously reacted to chlorhexidine. On babies younger than 15 months of age. How to use CHG solution Use CHG only as told by your health care provider, and follow the instructions on the label. Use the full amount of CHG as directed. Usually, this is one bottle. During a shower Follow these steps when using CHG solution during a shower (unless your health care provider gives you different instructions): Start the shower. Use your normal soap and shampoo to wash your face and hair. Turn off the shower or move out of  the shower stream. Pour the CHG onto a clean washcloth. Do not use any type of brush or rough-edged sponge. Starting at your neck, lather your body down to your toes. Make sure you follow these instructions: If you will be having surgery, pay special attention to the part of your body where you will be having surgery. Scrub this area for at least 1 minute. Do not use CHG on your head or face. If the solution gets into your ears or eyes, rinse them well with water. Avoid your genital area. Avoid any areas of skin that have broken skin, cuts, or scrapes. Scrub your back and under your arms. Make sure to wash skin folds. Let the lather sit on your skin for 1-2 minutes or as long as told by your health care provider. Thoroughly rinse your entire body in the shower. Make sure that all body creases and crevices are rinsed well. Dry off with a clean towel. Do not put any substances on your body afterward--such as powder, lotion, or perfume--unless you are told to do so by your health care provider. Only use lotions that are recommended by the manufacturer. Put on clean clothes or pajamas. If it is the night before your surgery, sleep in clean sheets.  During a sponge bath Follow these steps when using CHG solution during a sponge bath (unless your health care provider gives you different instructions): Use your normal soap and shampoo to wash your face and hair. Pour the CHG onto a clean washcloth. Starting at your neck, lather your body down to your toes. Make sure you follow these instructions: If you will be having surgery, pay special attention to the part of your body where you will be having surgery. Scrub this area for at least 1 minute. Do not use CHG on your head or face. If the solution gets into your ears or eyes, rinse them well with water. Avoid your genital area. Avoid any areas of skin that have broken skin, cuts, or scrapes. Scrub your back and under your arms. Make sure to wash skin  folds. Let  the lather sit on your skin for 1-2 minutes or as long as told by your health care provider. Using a different clean, wet washcloth, thoroughly rinse your entire body. Make sure that all body creases and crevices are rinsed well. Dry off with a clean towel. Do not put any substances on your body afterward--such as powder, lotion, or perfume--unless you are told to do so by your health care provider. Only use lotions that are recommended by the manufacturer. Put on clean clothes or pajamas. If it is the night before your surgery, sleep in clean sheets. How to use CHG prepackaged cloths Only use CHG cloths as told by your health care provider, and follow the instructions on the label. Use the CHG cloth on clean, dry skin. Do not use the CHG cloth on your head or face unless your health care provider tells you to. When washing with the CHG cloth: Avoid your genital area. Avoid any areas of skin that have broken skin, cuts, or scrapes. Before surgery Follow these steps when using a CHG cloth to clean before surgery (unless your health care provider gives you different instructions): Using the CHG cloth, vigorously scrub the part of your body where you will be having surgery. Scrub using a back-and-forth motion for 3 minutes. The area on your body should be completely wet with CHG when you are done scrubbing. Do not rinse. Discard the cloth and let the area air-dry. Do not put any substances on the area afterward, such as powder, lotion, or perfume. Put on clean clothes or pajamas. If it is the night before your surgery, sleep in clean sheets.  For general bathing Follow these steps when using CHG cloths for general bathing (unless your health care provider gives you different instructions). Use a separate CHG cloth for each area of your body. Make sure you wash between any folds of skin and between your fingers and toes. Wash your body in the following order, switching to a new cloth  after each step: The front of your neck, shoulders, and chest. Both of your arms, under your arms, and your hands. Your stomach and groin area, avoiding the genitals. Your right leg and foot. Your left leg and foot. The back of your neck, your back, and your buttocks. Do not rinse. Discard the cloth and let the area air-dry. Do not put any substances on your body afterward--such as powder, lotion, or perfume--unless you are told to do so by your health care provider. Only use lotions that are recommended by the manufacturer. Put on clean clothes or pajamas. Contact a health care provider if: Your skin gets irritated after scrubbing. You have questions about using your solution or cloth. You swallow any chlorhexidine. Call your local poison control center [phone] in the [place] Get help right away if: Your eyes itch badly, or they become very red or swollen. Your skin itches badly and is red or swollen. Your hearing changes. You have trouble seeing. You have swelling or tingling in your mouth or throat. You have trouble breathing. These symptoms may represent a serious problem that is an emergency. Do not wait to see if the symptoms will go away. Get medical help right away. Call your local emergency services (911 in the [place] Do not drive yourself to the hospital. Summary Chlorhexidine gluconate (CHG) is a germ-killing (antiseptic) solution that is used to clean the skin. Cleaning your skin with CHG may help to lower your risk for infection. You may be given CHG  to use for bathing. It may be in a bottle or in a prepackaged cloth to use on your skin. Carefully follow your health care provider's instructions and the instructions on the product label. Do not use CHG if you have a chlorhexidine allergy. Contact your health care provider if your skin gets irritated after scrubbing. This information is not intended to replace advice given to you by your health care provider. Make sure you  discuss any questions you have with your health care provider. Document Revised: 01/25/2022 Document Reviewed: 12/08/2020 Elsevier Patient Education  2023 [employer].

## 2023-09-06 ENCOUNTER — Encounter (HOSPITAL_COMMUNITY)
Admission: RE | Admit: 2023-09-06 | Discharge: 2023-09-06 | Disposition: A | Discharge status: Home / Self Care | Payer: 59 | Source: Ambulatory Visit | Attending: Urology | Admitting: Urology

## 2023-09-06 ENCOUNTER — Telehealth: Payer: Self-pay

## 2023-09-06 ENCOUNTER — Encounter (HOSPITAL_COMMUNITY): Payer: Self-pay

## 2023-09-06 VITALS — BP 97/65 | HR 91 | Temp 97.6°F | Resp 18 | Ht 62.0 in | Wt 125.0 lb

## 2023-09-06 DIAGNOSIS — E876 Hypokalemia: Secondary | ICD-10-CM

## 2023-09-06 DIAGNOSIS — Z794 Long term (current) use of insulin: Secondary | ICD-10-CM | POA: Insufficient documentation

## 2023-09-06 DIAGNOSIS — E1165 Type 2 diabetes mellitus with hyperglycemia: Secondary | ICD-10-CM | POA: Diagnosis not present

## 2023-09-06 DIAGNOSIS — Z01818 Encounter for other preprocedural examination: Secondary | ICD-10-CM | POA: Diagnosis not present

## 2023-09-06 LAB — BASIC METABOLIC PANEL
Anion gap: 10 (ref 5–15)
BUN: 7 mg/dL (ref 6–20)
CO2: 27 mmol/L (ref 22–32)
Calcium: 8 mg/dL — ABNORMAL LOW (ref 8.9–10.3)
Chloride: 95 mmol/L — ABNORMAL LOW (ref 98–111)
Creatinine, Ser: 0.6 mg/dL (ref 0.44–1.00)
GFR, Estimated: >60 mL/min (ref >60)
Glucose, Bld: 131 mg/dL — ABNORMAL HIGH (ref 70–99)
Potassium: 2.4 mmol/L — CL (ref 3.5–5.1)
Sodium: 132 mmol/L — ABNORMAL LOW (ref 135–145)

## 2023-09-06 NOTE — Telephone Encounter (Signed)
Patient called, made aware, and voiced understanding.

## 2023-09-06 NOTE — Progress Notes (Signed)
Critical potassium 2.4 and Dr Ronne Binning aware

## 2023-09-06 NOTE — Telephone Encounter (Signed)
See surgery referral. New surgery date 12/03

## 2023-09-06 NOTE — Telephone Encounter (Signed)
-----   Message from Wilkie Aye sent at 09/06/2023 11:42 AM EST ----- She needs to contact PCP to start potassium suppliment ----- Message ----- From: Leory Plowman, Lab In [place] Sent: 09/06/2023  10:38 AM EST To: Malen Gauze, MD

## 2023-09-07 DIAGNOSIS — E1165 Type 2 diabetes mellitus with hyperglycemia: Secondary | ICD-10-CM | POA: Diagnosis not present

## 2023-09-07 DIAGNOSIS — Z299 Encounter for prophylactic measures, unspecified: Secondary | ICD-10-CM | POA: Diagnosis not present

## 2023-09-07 DIAGNOSIS — Z6821 Body mass index (BMI) 21.0-21.9, adult: Secondary | ICD-10-CM | POA: Diagnosis not present

## 2023-09-07 DIAGNOSIS — E876 Hypokalemia: Secondary | ICD-10-CM | POA: Diagnosis not present

## 2023-09-09 DIAGNOSIS — K219 Gastro-esophageal reflux disease without esophagitis: Secondary | ICD-10-CM | POA: Diagnosis not present

## 2023-09-09 DIAGNOSIS — E1165 Type 2 diabetes mellitus with hyperglycemia: Secondary | ICD-10-CM | POA: Diagnosis not present

## 2023-09-09 DIAGNOSIS — E876 Hypokalemia: Secondary | ICD-10-CM | POA: Diagnosis not present

## 2023-09-09 DIAGNOSIS — Z299 Encounter for prophylactic measures, unspecified: Secondary | ICD-10-CM | POA: Diagnosis not present

## 2023-09-09 DIAGNOSIS — R112 Nausea with vomiting, unspecified: Secondary | ICD-10-CM | POA: Diagnosis not present

## 2023-09-13 ENCOUNTER — Ambulatory Visit (HOSPITAL_COMMUNITY): Payer: 59 | Admitting: Certified Registered"

## 2023-09-13 ENCOUNTER — Ambulatory Visit (HOSPITAL_COMMUNITY)
Admission: RE | Admit: 2023-09-13 | Discharge: 2023-09-13 | Disposition: A | Discharge status: Home / Self Care | Payer: 59 | Attending: Urology | Admitting: Urology

## 2023-09-13 ENCOUNTER — Encounter (HOSPITAL_COMMUNITY)
Admission: RE | Disposition: A | Discharge status: Home / Self Care | Payer: Self-pay | Source: Home / Self Care | Attending: Urology

## 2023-09-13 ENCOUNTER — Encounter (HOSPITAL_COMMUNITY): Payer: Self-pay | Admitting: Urology

## 2023-09-13 DIAGNOSIS — Z79899 Other long term (current) drug therapy: Secondary | ICD-10-CM | POA: Insufficient documentation

## 2023-09-13 DIAGNOSIS — Z7984 Long term (current) use of oral hypoglycemic drugs: Secondary | ICD-10-CM | POA: Diagnosis not present

## 2023-09-13 DIAGNOSIS — D3502 Benign neoplasm of left adrenal gland: Secondary | ICD-10-CM | POA: Diagnosis not present

## 2023-09-13 DIAGNOSIS — N3281 Overactive bladder: Secondary | ICD-10-CM

## 2023-09-13 DIAGNOSIS — Z7982 Long term (current) use of aspirin: Secondary | ICD-10-CM | POA: Diagnosis not present

## 2023-09-13 DIAGNOSIS — N319 Neuromuscular dysfunction of bladder, unspecified: Secondary | ICD-10-CM | POA: Diagnosis not present

## 2023-09-13 DIAGNOSIS — J439 Emphysema, unspecified: Secondary | ICD-10-CM | POA: Diagnosis not present

## 2023-09-13 DIAGNOSIS — K219 Gastro-esophageal reflux disease without esophagitis: Secondary | ICD-10-CM | POA: Insufficient documentation

## 2023-09-13 DIAGNOSIS — Z794 Long term (current) use of insulin: Secondary | ICD-10-CM | POA: Insufficient documentation

## 2023-09-13 DIAGNOSIS — R911 Solitary pulmonary nodule: Secondary | ICD-10-CM | POA: Diagnosis not present

## 2023-09-13 DIAGNOSIS — E119 Type 2 diabetes mellitus without complications: Secondary | ICD-10-CM | POA: Diagnosis not present

## 2023-09-13 DIAGNOSIS — F319 Bipolar disorder, unspecified: Secondary | ICD-10-CM | POA: Diagnosis not present

## 2023-09-13 DIAGNOSIS — J438 Other emphysema: Secondary | ICD-10-CM | POA: Diagnosis not present

## 2023-09-13 HISTORY — PX: INTERSTIM IMPLANT PLACEMENT: SHX5130

## 2023-09-13 LAB — BASIC METABOLIC PANEL
Anion gap: 9 (ref 5–15)
BUN: 8 mg/dL (ref 6–20)
CO2: 25 mmol/L (ref 22–32)
Calcium: 8.6 mg/dL — ABNORMAL LOW (ref 8.9–10.3)
Chloride: 104 mmol/L (ref 98–111)
Creatinine, Ser: 0.58 mg/dL (ref 0.44–1.00)
GFR, Estimated: >60 mL/min (ref >60)
Glucose, Bld: 139 mg/dL — ABNORMAL HIGH (ref 70–99)
Potassium: 4.9 mmol/L (ref 3.5–5.1)
Sodium: 138 mmol/L (ref 135–145)

## 2023-09-13 LAB — POCT I-STAT, CHEM 8
BUN: 6 mg/dL (ref 6–20)
Calcium, Ion: 1.12 mmol/L — ABNORMAL LOW (ref 1.15–1.40)
Chloride: 103 mmol/L (ref 98–111)
Creatinine, Ser: 0.6 mg/dL (ref 0.44–1.00)
Glucose, Bld: 137 mg/dL — ABNORMAL HIGH (ref 70–99)
HCT: 42 % (ref 36.0–46.0)
Hemoglobin: 14.3 g/dL (ref 12.0–15.0)
Potassium: 4.9 mmol/L (ref 3.5–5.1)
Sodium: 138 mmol/L (ref 135–145)
TCO2: 24 mmol/L (ref 22–32)

## 2023-09-13 LAB — GLUCOSE, CAPILLARY: Glucose-Capillary: 118 mg/dL — ABNORMAL HIGH (ref 70–99)

## 2023-09-13 SURGERY — INSERTION, SACRAL NERVE STIMULATOR, INTERSTIM, STAGE 2
Anesthesia: General | Site: Back

## 2023-09-13 MED ORDER — LIDOCAINE-EPINEPHRINE (PF) 1 %-1:200000 IJ SOLN
INTRAMUSCULAR | Status: DC | PRN
  Administered 2023-09-13: 10 mL

## 2023-09-13 MED ORDER — ONDANSETRON HCL 4 MG/2ML IJ SOLN
INTRAMUSCULAR | Status: AC
Start: 2023-09-13 — End: ?
  Filled 2023-09-13: qty 2

## 2023-09-13 MED ORDER — HYDROCODONE-ACETAMINOPHEN 5-325 MG PO TABS: 1.0000 | ORAL_TABLET | Freq: Four times a day (QID) | ORAL | 0 refills | Status: DC | PRN

## 2023-09-13 MED ORDER — PROPOFOL 500 MG/50ML IV EMUL
INTRAVENOUS | Status: DC | PRN
  Administered 2023-09-13: 100 ug/kg/min via INTRAVENOUS

## 2023-09-13 MED ORDER — LIDOCAINE-EPINEPHRINE (PF) 1 %-1:200000 IJ SOLN
INTRAMUSCULAR | Status: AC
  Filled 2023-09-13: qty 30

## 2023-09-13 MED ORDER — STERILE WATER FOR IRRIGATION IR SOLN
Status: DC | PRN
  Administered 2023-09-13: 1000 mL

## 2023-09-13 MED ORDER — LACTATED RINGERS IV SOLN: INTRAVENOUS | Status: DC

## 2023-09-13 MED ORDER — PROPOFOL 10 MG/ML IV BOLUS
INTRAVENOUS | Status: AC
  Filled 2023-09-13: qty 20

## 2023-09-13 MED ORDER — AMOXICILLIN-POT CLAVULANATE 875-125 MG PO TABS: 1.0000 | ORAL_TABLET | Freq: Two times a day (BID) | ORAL | 0 refills | Status: DC

## 2023-09-13 MED ORDER — CEFAZOLIN SODIUM-DEXTROSE 2-4 GM/100ML-% IV SOLN
2.0000 g | INTRAVENOUS | Status: AC
Start: 2023-09-13 — End: 2023-09-13
  Administered 2023-09-13: 2 g via INTRAVENOUS

## 2023-09-13 MED ORDER — CHLORHEXIDINE GLUCONATE 0.12 % MT SOLN
15.0000 mL | Freq: Once | OROMUCOSAL | Status: AC
  Administered 2023-09-13: 15 mL via OROMUCOSAL

## 2023-09-13 MED ORDER — MIDAZOLAM HCL 2 MG/2ML IJ SOLN
INTRAMUSCULAR | Status: AC
Start: 2023-09-13 — End: ?
  Filled 2023-09-13: qty 2

## 2023-09-13 MED ORDER — BUPIVACAINE HCL (PF) 0.25 % IJ SOLN
INTRAMUSCULAR | Status: AC
  Filled 2023-09-13: qty 30

## 2023-09-13 MED ORDER — MIDAZOLAM HCL 2 MG/2ML IJ SOLN
INTRAMUSCULAR | Status: DC | PRN
  Administered 2023-09-13: 2 mg via INTRAVENOUS

## 2023-09-13 MED ORDER — CEFAZOLIN SODIUM-DEXTROSE 2-4 GM/100ML-% IV SOLN
INTRAVENOUS | Status: AC
  Filled 2023-09-13: qty 100

## 2023-09-13 MED ORDER — DEXAMETHASONE SODIUM PHOSPHATE 10 MG/ML IJ SOLN
INTRAMUSCULAR | Status: AC
Start: 2023-09-13 — End: ?
  Filled 2023-09-13: qty 1

## 2023-09-13 MED ORDER — FENTANYL CITRATE (PF) 100 MCG/2ML IJ SOLN
INTRAMUSCULAR | Status: AC
  Filled 2023-09-13: qty 2

## 2023-09-13 MED ORDER — ORAL CARE MOUTH RINSE: 15.0000 mL | Freq: Once | OROMUCOSAL | Status: AC

## 2023-09-13 MED ORDER — PROPOFOL 10 MG/ML IV BOLUS
INTRAVENOUS | Status: DC | PRN
  Administered 2023-09-13: 50 mg via INTRAVENOUS

## 2023-09-13 MED ORDER — FENTANYL CITRATE (PF) 100 MCG/2ML IJ SOLN
INTRAMUSCULAR | Status: DC | PRN
  Administered 2023-09-13 (×2): 50 ug via INTRAVENOUS

## 2023-09-13 SURGICAL SUPPLY — 23 items
COVER LIGHT HANDLE STERIS (MISCELLANEOUS) ×2 IMPLANT
COVER PROBE W GEL 5X96 (DRAPES) ×1 IMPLANT
DECANTER SPIKE VIAL GLASS SM (MISCELLANEOUS) ×1 IMPLANT
DERMABOND ADVANCED .7 DNX12 (GAUZE/BANDAGES/DRESSINGS) ×1 IMPLANT
ELECT REM PT RETURN 9FT ADLT (ELECTROSURGICAL) ×1
ELECTRODE REM PT RTRN 9FT ADLT (ELECTROSURGICAL) IMPLANT
GAUZE SPONGE 4X4 12PLY STRL (GAUZE/BANDAGES/DRESSINGS) ×1 IMPLANT
GLOVE BIO SURGEON STRL SZ8 (GLOVE) ×1 IMPLANT
GLOVE BIOGEL PI IND STRL 7.0 (GLOVE) ×2 IMPLANT
GOWN STRL REUS W/TWL LRG LVL3 (GOWN DISPOSABLE) ×1 IMPLANT
GOWN STRL REUS W/TWL XL LVL3 (GOWN DISPOSABLE) ×1 IMPLANT
KIT HANDSET INTERSTIM COMM (NEUROSURGERY SUPPLIES) ×1 IMPLANT
KIT TURNOVER CYSTO (KITS) ×1 IMPLANT
NDL HYPO 21X1.5 SAFETY (NEEDLE) IMPLANT
NEEDLE HYPO 21X1.5 SAFETY (NEEDLE) ×1
PACK MINOR (CUSTOM PROCEDURE TRAY) IMPLANT
POSITIONER HEAD 8X9X4 ADT (SOFTGOODS) ×1 IMPLANT
SET BASIN LINEN APH (SET/KITS/TRAYS/PACK) ×1 IMPLANT
STIMULATOR INTERSTIM 2X1.7X.3 (Miscellaneous) ×1 IMPLANT
SUT MNCRL AB 4-0 PS2 18 (SUTURE) ×1 IMPLANT
SUT VIC AB 3-0 SH 27X BRD (SUTURE) ×1 IMPLANT
SYR CONTROL 10ML LL (SYRINGE) IMPLANT
WATER STERILE IRR 1000ML POUR (IV SOLUTION) ×1 IMPLANT

## 2023-09-13 NOTE — Anesthesia Preprocedure Evaluation (Addendum)
Anesthesia Evaluation  Patient identified by MRN, date of birth, ID band Patient awake    Reviewed: Allergy & Precautions, H&P , NPO status , Patient's Chart, lab work & pertinent test results  Airway Mallampati: III  TM Distance: >3 FB Neck ROM: Full    Dental  (+) Dental Advisory Given, Caps Bridge upper:   Pulmonary Patient abstained from smoking. CT - Lungs/Pleura: Central airways are patent mild paraseptal emphysema. No consolidation, pleural effusion or pneumothorax. Stable irregular solid pulmonary nodule of the right lower lobe measuring 7 x 5 mm on series 4, image 80.   Upper Abdomen: Stable left adrenal gland adenoma, no specific follow-up imaging is recommended. No acute abnormality.    Pulmonary exam normal breath sounds clear to auscultation       Cardiovascular Exercise Tolerance: Good Normal cardiovascular exam Rhythm:Regular Rate:Normal     Neuro/Psych  PSYCHIATRIC DISORDERS  Depression Bipolar Disorder   negative neurological ROS     GI/Hepatic Neg liver ROS,GERD  Medicated and Controlled,,  Endo/Other  diabetes, Well Controlled, Type 2, Oral Hypoglycemic Agents, Insulin Dependent    Renal/GU negative Renal ROS  negative genitourinary   Musculoskeletal negative musculoskeletal ROS (+)    Abdominal Normal abdominal exam  (+)   Peds negative pediatric ROS (+)  Hematology negative hematology ROS (+)   Anesthesia Other Findings Left adrenal mass (HCC) Microscopic hematuria Urge incontinence of urine  Potasium is 4.9 today!    Reproductive/Obstetrics negative OB ROS                              Anesthesia Physical Anesthesia Plan  ASA: 3  Anesthesia Plan: General   Post-op Pain Management: Minimal or no pain anticipated   Induction: Intravenous  PONV Risk Score and Plan: Propofol infusion  Airway Management Planned: Nasal Cannula and Natural  Airway  Additional Equipment: None  Intra-op Plan:   Post-operative Plan:   Informed Consent: I have reviewed the patients History and Physical, chart, labs and discussed the procedure including the risks, benefits and alternatives for the proposed anesthesia with the patient or authorized representative who has indicated his/her understanding and acceptance.     Dental advisory given  Plan Discussed with: CRNA  Anesthesia Plan Comments:         Anesthesia Quick Evaluation

## 2023-09-13 NOTE — Transfer of Care (Signed)
Immediate Anesthesia Transfer of Care Note  Patient: Laura Suarez  Procedure(s) Performed: Leane Platt IMPLANT SECOND STAGE (Back)  Patient Location: PACU  Anesthesia Type:MAC  Level of Consciousness: awake, alert , drowsy, and patient cooperative  Airway & Oxygen Therapy: Patient Spontanous Breathing and Patient connected to nasal cannula oxygen  Post-op Assessment: Report given to RN, Post -op Vital signs reviewed and stable, and Patient moving all extremities X 4  Post vital signs: Reviewed and stable  Last Vitals:  Vitals Value Taken Time  BP 145/82 09/13/23 1250  Temp 35.8 C 09/13/23 1250  Pulse 76 09/13/23 1252  Resp 25 09/13/23 1252  SpO2 100 % 09/13/23 1252  Vitals shown include unfiled device data.  Last Pain:  Vitals:   09/13/23 1118  PainSc: 0-No pain         Complications: No notable events documented.

## 2023-09-13 NOTE — H&P (Signed)
HPI: S he reports some symptomatic improvement since the procedure. She reports  decreased  urinary frequency, nocturia, urgency, and urge incontinence. Voiding 7-8x/day and 1x/night on average. Continues leaking several times throughout the day and has to change her clothes about 3-4x/day on average due to leakage; denies using pads or diapers at this time per her preference. She has worked on decreasing her caffeine intake - no longer drinking tea all day, now drinking more water.   She denies dysuria, gross hematuria, straining to void, or sensations of incomplete emptying.     Fall Screening: Do you usually have a device to assist in your mobility? No    Medications:       Current Outpatient Medications  Medication Sig Dispense Refill   amoxicillin-clavulanate (AUGMENTIN) 875-125 MG tablet Take 1 tablet by mouth 2 (two) times daily. 14 tablet 0   Apoaequorin (PREVAGEN PO) Take 1 capsule by mouth daily.       aspirin 81 MG EC tablet Take 81 mg by mouth daily.       atorvastatin (LIPITOR) 40 MG tablet Take 40 mg by mouth at bedtime.       Biotin 5000 MCG CAPS Take 5,000 mcg by mouth daily.       busPIRone (BUSPAR) 10 MG tablet Take 10 mg by mouth 3 (three) times daily.       Coenzyme Q10 100 MG CHEW Chew 100 mg by mouth daily.       FARXIGA 5 MG TABS tablet Take 5 mg by mouth every morning.       ferrous sulfate 324 MG TBEC Take 324 mg by mouth 4 (four) times a week.       HYDROcodone-acetaminophen (NORCO) 5-325 MG tablet Take 1 tablet by mouth every 6 (six) hours as needed for moderate pain (pain score 4-6). 30 tablet 0   hydrOXYzine (ATARAX) 25 MG tablet Take 25 mg by mouth daily as needed for anxiety.       lisinopril (ZESTRIL) 2.5 MG tablet Take 2.5 mg by mouth every morning.       metFORMIN (GLUCOPHAGE) 1000 MG tablet Take 1,000 mg by mouth 2 (two) times daily.       Multiple Vitamin (MULTIVITAMIN WITH MINERALS) TABS tablet Take 1 tablet by mouth daily.       omeprazole (PRILOSEC) 40  MG capsule Take 40 mg by mouth at bedtime.       OXcarbazepine (TRILEPTAL) 150 MG tablet Take 300 mg by mouth at bedtime.       PARoxetine (PAXIL) 40 MG tablet Take 40 mg by mouth daily.       Probiotic Product (PROBIOTIC PO) Take 1 tablet by mouth 3 (three) times a week.       risperiDONE (RISPERDAL) 1 MG tablet Take 1-2 mg by mouth See admin instructions. Take 1 mg in the morning and 2 mg in the evening       traZODone (DESYREL) 150 MG tablet Take 150 mg by mouth at bedtime.       TRESIBA FLEXTOUCH 200 UNIT/ML FlexTouch Pen Inject 26 Units into the skin every evening.          No current facility-administered medications for this visit.        Allergies: Allergies       Allergies  Allergen Reactions   Sulfa Antibiotics Rash   Ziprasidone Hcl Other (See Comments)      Unable to move limbs  Past Medical History:  Diagnosis Date   Bipolar disorder (HCC)     Depression     Diabetes mellitus without complication (HCC)     High cholesterol               Past Surgical History:  Procedure Laterality Date   ABDOMINAL HYSTERECTOMY       BIOPSY   05/16/2023    Procedure: BIOPSY;  Surgeon: Franky Macho, MD;  [facility];  Service: Endoscopy;;   COLONOSCOPY WITH PROPOFOL N/A 05/16/2023    Procedure: COLONOSCOPY WITH PROPOFOL;  Surgeon: Franky Macho, MD;  [facility];  Service: Endoscopy;  Laterality: N/A;  7:30AM;ASA 2   INTERSTIM IMPLANT PLACEMENT N/A 08/01/2023    Procedure: Leane Platt IMPLANT FIRST STAGE;  Surgeon: Malen Gauze, MD;  Location: [facility];  Service: Urology;  Laterality: N/A;        History reviewed. No pertinent family history.     Social History         Socioeconomic History   Marital status: Married      Spouse name: Not on file   Number of children: Not on file   Years of education: Not on file   Highest education level: Not on file  Occupational History   Not on file  Tobacco Use   Smoking  status: Never   Smokeless tobacco: Never  Vaping Use   Vaping status: Never Used  Substance and Sexual Activity   Alcohol use: Not on file   Drug use: Never   Sexual activity: Not on file  Other Topics Concern   Not on file  Social History Narrative   Not on file    Social Determinants of Health        Financial Resource Strain: Low Risk  (01/06/2022)    Received from [facility], [facility]    Overall Financial Resource Strain (CARDIA)     Difficulty of Paying Living Expenses: Not hard at all  Food Insecurity: No Food Insecurity (06/03/2021)    Received from [facility], [facility]    Hunger Vital Sign     Worried About Running Out of Food in the Last Year: Never true     Ran Out of Food in the Last Year: Never true  Transportation Needs: No Transportation Needs (01/06/2022)    Received from [facility], [facility]    [facility] - Transportation     Lack of Transportation (Medical): No     Lack of Transportation (Non-Medical): No  Physical Activity: Insufficiently Active (06/03/2021)    Received from [facility], [facility]    Exercise Vital Sign     Days of Exercise per Week: 4 days     Minutes of Exercise per Session: 30 min  Stress: No Stress Concern Present (06/03/2021)    Received from [facility], [facility] - Occupational Stress Questionnaire     Feeling of Stress : Not at all  Social Connections: Socially Integrated (06/03/2021)    Received from [facility], [facility]    Social Connection and Isolation Panel [NHANES]     Frequency of Communication with Friends and Family: Twice a week     Frequency of Social Gatherings with Friends and Family: Twice a week     Attends Religious Services: 1 to 4 times per year     Active Member  of Clubs or Organizations: No     Attends Banker Meetings: 1 to 4 times per year     Marital Status: Married  Catering manager  Violence: Not At Risk (06/03/2021)    Received from [facility], [facility]    Humiliation, Afraid, Rape, and Kick questionnaire     Fear of Current or Ex-Partner: No     Emotionally Abused: No     Physically Abused: No     Sexually Abused: No      SUBJECTIVE   Review of Systems Constitutional: Patient denies any unintentional weight loss or change in strength lntegumentary: Patient denies any rashes or pruritus Cardiovascular: Patient denies chest pain or syncope Respiratory: Patient denies shortness of breath Gastrointestinal: Patient denies nausea, vomiting, constipation, or diarrhea Musculoskeletal: Patient denies muscle cramps or weakness Neurologic: Patient denies convulsions or seizures Allergic/Immunologic: Patient denies recent allergic reaction(s) Hematologic/Lymphatic: Patient denies bleeding tendencies Endocrine: Patient denies heat/cold intolerance   GU: As per HPI.   OBJECTIVE    Vitals:    08/10/23 1337  BP: 99/69  Pulse: (!) 126  Temp: 98.8 F (37.1 C)    Body mass index is 22.86 kg/m.   Physical Examination Constitutional: No obvious distress; patient is non-toxic appearing  Cardiovascular: No visible lower extremity edema.  Respiratory: The patient does not have audible wheezing/stridor; respirations do not appear labored  Gastrointestinal: Abdomen non-distended Musculoskeletal: Normal ROM of UEs  Neurologic: CN 2-12 grossly intact Psychiatric: Answered questions appropriately with normal affect  Hematologic/Lymphatic/Immunologic: No obvious bruises or sites of spontaneous bleeding   GU: Incision site(s) well healed, intact, with no surrounding erythema, edema, crepitus, fluctuance, drainage / discharge, warmth, significant tenderness to palpation.    UA: 3-10 RBC/hpf, no evidence of UTI  PVR: 0 ml   ASSESSMENT OAB (overactive bladder) - Plan: Urinalysis, Routine w reflex microscopic, BLADDER SCAN AMB NON-IMAGING   Urge incontinence of  urine   Postop check   We reviewed the operative procedures and findings. Pre-operative symptoms are improved since the procedure. Surgical site healing well.   She elected to proceed with Stage 2 as scheduled on 08/25/2023.   Pt verbalized understanding and agreement. All questions were answered.   PLAN The risks/benefits/alternatives to Stage 2 interstim was explained to the patient and she understands and wishes to proceed with surgery

## 2023-09-13 NOTE — Anesthesia Postprocedure Evaluation (Signed)
Anesthesia Post Note  Patient: Laura Suarez  Procedure(s) Performed: Leane Platt IMPLANT SECOND STAGE (Back)  Patient location during evaluation: PACU Anesthesia Type: General Level of consciousness: awake and alert Pain management: pain level controlled Vital Signs Assessment: post-procedure vital signs reviewed and stable Respiratory status: spontaneous breathing, nonlabored ventilation, respiratory function stable and patient connected to nasal cannula oxygen Cardiovascular status: blood pressure returned to baseline and stable Postop Assessment: no apparent nausea or vomiting Anesthetic complications: no   There were no known notable events for this encounter.   Last Vitals:  Vitals:   09/13/23 1303 09/13/23 1319  BP:  134/86  Pulse: 90 88  Resp: (!) 21 20  Temp:  36.4 C  SpO2: 99% 99%    Last Pain:  Vitals:   09/13/23 1319  TempSrc: Oral  PainSc: 0-No pain                 Cora Stetson L Hugh Kamara

## 2023-09-13 NOTE — Progress Notes (Signed)
Bmp and I-stat showed K+ level is 4.9 today.  Dr. Leta Jungling was notified of results.   Called patients husband, Greig Castilla,  to inform him the patients procedure will go on as planned, her K+ results are normal and once she goes back she will probably be ready for d/c in about 2 hours.  He voiced understanding.  Greig Castilla states he has to get his grandsons off the bus, so call him when she's ready for d/c.

## 2023-09-13 NOTE — Op Note (Signed)
Preoperative diagnosis: overactive bladder  Postoperative diagnosis: Same  Procedure: Placement of InterStim stage 2 and impedance check  Surgeon: Dr. Wilkie Aye  Assistant: None  Antibiotics: Ancef  Drains: None  Indications: The patient is a 57yo with a hx of neurogenic bladder and unobstructed urinary retention. She has Stage 1 interstim  4 weeks ago and has been able to void normally since placement with minimal PVR. After discussing treatment options she has elected to proceed with Interstim Stage 2 implantation  Procedure in detail: Prior to the procedure consent was obtained. The patient was brought to the operating room and a brief time out was completed to ensure correct patient, correct procedure and correct site. Preoperative antibiotics were given. Extra care was taken positioning the patient in a prone position. Usual skin preparation was utilized. The previous incision in the left upper buttock was opend and previous vicryl suture removed. We then brought the lead connector into the operative field. We then removed the silk suture exposing the screws holding the temporary lead. We then unscrewed the lead and removed it. We then irrigated the pocket with GU irrigation followed by sterile water. We then attached the generator and battery to the lead and tightened the screws. We then placed the generator in the previous pocket int he left upper buttock. We closed the deep subcutaneous tissue with 2-0 vicryl in a running fashion. The incisions were then closed with running 3-0 vicryl.  Impedance check was done utilizing sterile technique and was normal in all 4 positions We then placed dermabond on the incisions and this concluded the procedure which was well tolerated by the patient.  Complications: none  Condition: stable, transferred to PACU  Plan: The patient is to be discharged home after she voids in the PACU. She is to followup in 4 weeks for wound check

## 2023-09-13 NOTE — Progress Notes (Signed)
Fingerstick CBG not done because a BMP was drawn around 1100 today and it showed the glucose was 139.

## 2023-09-15 ENCOUNTER — Telehealth: Payer: Self-pay | Admitting: Pulmonary Disease

## 2023-09-15 DIAGNOSIS — R911 Solitary pulmonary nodule: Secondary | ICD-10-CM

## 2023-09-15 NOTE — Telephone Encounter (Signed)
Patient needs a CT scan completed before her January 23rd appointment. It looks like she missed one and then cancelled the other one that was scheduled. She may need a new order placed.

## 2023-09-16 NOTE — Telephone Encounter (Signed)
Pt will need a new CT order if CT is still needed as previous ordered has expired.

## 2023-09-19 ENCOUNTER — Encounter (HOSPITAL_COMMUNITY): Payer: Self-pay | Admitting: Urology

## 2023-09-22 NOTE — Progress Notes (Signed)
Name: Laura Suarez DOB: November 03, 1965 MRN: [id number]  Diagnoses: Post-operative state  HPI: Laura Suarez presents post-operatively.  - GU history includes: 1. OAB with urge incontinence. 2. 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 - 09/15/2021: CT hematuria protocol  - 09/21/2021: Cystoscopy by Dr. Pete Glatter was unremarkable.   Today She presents s/p the following procedures on 09/13/2023 by Dr. Ronne Binning:  Preoperative diagnosis: overactive bladder   Postoperative diagnosis: Same   Procedure: Placement of InterStim stage 2 and impedance check  Postop course: Today She reports doing well with no acute concerns. Reports that her urinary symptoms continue to be well improved with the sacral neuromodulation therapy. She denies redness, warmth, tenderness, swelling, or drainage at the incision site(s). She denies fevers.  She denies dysuria, gross hematuria, straining to void, or sensations of incomplete emptying.   Fall Screening: Do you usually have a device to assist in your mobility? No   Medications: Current Outpatient Medications  Medication Sig Dispense Refill   amoxicillin-clavulanate (AUGMENTIN) 875-125 MG tablet Take 1 tablet by mouth 2 (two) times daily. 14 tablet 0   Apoaequorin (PREVAGEN PO) Take 1 capsule by mouth daily.     aspirin 81 MG EC tablet Take 81 mg by mouth daily.     atorvastatin (LIPITOR) 40 MG tablet Take 40 mg by mouth at bedtime.     Biotin 5000 MCG CAPS Take 5,000 mcg by mouth daily.     busPIRone (BUSPAR) 10 MG tablet Take 10 mg by mouth 3 (three) times daily.     Coenzyme Q10 100 MG CHEW Chew 100 mg by mouth daily.     FARXIGA 5 MG TABS tablet Take 5 mg by mouth every morning.     HYDROcodone-acetaminophen (NORCO) 5-325 MG tablet Take 1 tablet by mouth every 6 (six) hours as needed for moderate pain (pain score 4-6). 30 tablet 0   hydrOXYzine (ATARAX) 25 MG tablet Take 25 mg by mouth daily as needed for anxiety.     lisinopril  (ZESTRIL) 2.5 MG tablet Take 2.5 mg by mouth every morning.     metFORMIN (GLUCOPHAGE) 1000 MG tablet Take 1,000 mg by mouth 2 (two) times daily.     Multiple Vitamin (MULTIVITAMIN WITH MINERALS) TABS tablet Take 1 tablet by mouth daily.     omeprazole (PRILOSEC) 40 MG capsule Take 40 mg by mouth at bedtime.     OXcarbazepine (TRILEPTAL) 150 MG tablet Take 300 mg by mouth at bedtime.     PARoxetine (PAXIL) 40 MG tablet Take 40 mg by mouth daily.     Potassium 99 MG TABS Take 99 mg by mouth in the morning and at bedtime.     Probiotic Product (PROBIOTIC PO) Take 1 tablet by mouth 3 (three) times a week.     risperiDONE (RISPERDAL) 1 MG tablet Take 1-2 mg by mouth See admin instructions. Take 1 mg by mouth in the morning and 2 mg in the evening     traZODone (DESYREL) 150 MG tablet Take 150 mg by mouth at bedtime.     TRESIBA FLEXTOUCH 200 UNIT/ML FlexTouch Pen Inject 26 Units into the skin at bedtime.     ferrous sulfate 324 MG TBEC Take 324 mg by mouth 4 (four) times a week.     No current facility-administered medications for this visit.    Allergies: Allergies  Allergen Reactions   Sulfa Antibiotics Rash   Ziprasidone Hcl Other (See Comments)    Unable to move limbs  Past Medical History:  Diagnosis Date   Bipolar disorder (HCC)    Depression    Diabetes mellitus without complication (HCC)    High cholesterol    Past Surgical History:  Procedure Laterality Date   ABDOMINAL HYSTERECTOMY     BIOPSY  05/16/2023   Procedure: BIOPSY;  Surgeon: Franky Macho, MD;  [facility];  Service: Endoscopy;;   COLONOSCOPY WITH PROPOFOL N/A 05/16/2023   Procedure: COLONOSCOPY WITH PROPOFOL;  Surgeon: Franky Macho, MD;  [facility];  Service: Endoscopy;  Laterality: N/A;  7:30AM;ASA 2   INTERSTIM IMPLANT PLACEMENT N/A 08/01/2023   Procedure: Leane Platt IMPLANT FIRST STAGE;  Surgeon: Malen Gauze, MD;  Location: [facility];  Service: Urology;  Laterality:  N/A;   INTERSTIM IMPLANT PLACEMENT N/A 09/13/2023   Procedure: Leane Platt IMPLANT SECOND STAGE;  Surgeon: Malen Gauze, MD;  Location: [facility];  Service: Urology;  Laterality: N/A;   No family history on file. Social History   Socioeconomic History   Marital status: Married    Spouse name: Not on file   Number of children: Not on file   Years of education: Not on file   Highest education level: Not on file  Occupational History   Not on file  Tobacco Use   Smoking status: Never   Smokeless tobacco: Never  Vaping Use   Vaping status: Never Used  Substance and Sexual Activity   Alcohol use: Not on file   Drug use: Never   Sexual activity: Not on file  Other Topics Concern   Not on file  Social History Narrative   Not on file   Social Drivers of Health   Financial Resource Strain: Low Risk  (01/06/2022)   Received from [facility], [facility]   Overall Financial Resource Strain (CARDIA)    Difficulty of Paying Living Expenses: Not hard at all  Food Insecurity: No Food Insecurity (06/03/2021)   Received from [facility], [facility]   Hunger Vital Sign    Worried About Running Out of Food in the Last Year: Never true    Ran Out of Food in the Last Year: Never true  Transportation Needs: No Transportation Needs (01/06/2022)   Received from [facility], [facility]   [facility] - Transportation    Lack of Transportation (Medical): No    Lack of Transportation (Non-Medical): No  Physical Activity: Insufficiently Active (06/03/2021)   Received from [facility], [facility]   Exercise Vital Sign    Days of Exercise per Week: 4 days    Minutes of Exercise per Session: 30 min  Stress: No Stress Concern Present (06/03/2021)   Received from [facility], [facility] of [facility]ational Health - Occupational Stress Questionnaire    Feeling of Stress : Not at all  Social Connections: Socially Integrated (06/03/2021)   Received  from [facility], [facility]   Social Connection and Isolation Panel [NHANES]    Frequency of Communication with Friends and Family: Twice a week    Frequency of Social Gatherings with Friends and Family: Twice a week    Attends Religious Services: 1 to 4 times per year    Active Member of [employer] or Organizations: No    Attends Banker Meetings: 1 to 4 times per year    Marital Status: Married  Catering manager Violence: Not At Risk (06/03/2021)   Received from  [facility], [facility]   Humiliation, Afraid, Rape, and Kick questionnaire    Fear of Current or Ex-Partner: No    Emotionally Abused: No    Physically Abused: No    Sexually Abused: No    SUBJECTIVE  Review of Systems Constitutional: Patient denies any unintentional weight loss or change in strength lntegumentary: Patient denies any rashes or pruritus Cardiovascular: Patient denies chest pain or syncope Respiratory: Patient denies shortness of breath Gastrointestinal: Patient denies nausea, vomiting, constipation, or diarrhea Musculoskeletal: Patient denies muscle cramps or weakness Neurologic: Patient denies convulsions or seizures Allergic/Immunologic: Patient denies recent allergic reaction(s) Hematologic/Lymphatic: Patient denies bleeding tendencies Endocrine: Patient denies heat/cold intolerance  GU: As per HPI.  OBJECTIVE Vitals:   09/26/23 1108  BP: 114/79  Pulse: (!) 108   There is no height or weight on file to calculate BMI.  Physical Examination Constitutional: No obvious distress; patient is non-toxic appearing  Cardiovascular: No visible lower extremity edema.  Respiratory: The patient does not have audible wheezing/stridor; respirations do not appear labored  Gastrointestinal: Abdomen non-distended Musculoskeletal: Normal ROM of UEs  Skin:  Incision well approximated and intact, dry, and without erythema, warmth, or drainage.  Neurologic: CN 2-12 grossly  intact Psychiatric: Answered questions appropriately with normal affect  Hematologic/Lymphatic/Immunologic: No obvious bruises or sites of spontaneous bleeding  UA: 11-30 RBC/hpf, with no evidence of UTI PVR: 0 ml  ASSESSMENT: OAB (overactive bladder) - Plan: Urinalysis, Routine w reflex microscopic, BLADDER SCAN AMB NON-IMAGING, SUTURE REMOVAL  Urge incontinence of urine - Plan: Urinalysis, Routine w reflex microscopic, BLADDER SCAN AMB NON-IMAGING, SUTURE REMOVAL  Status post implantation of urinary electronic stimulator device - Plan: Urinalysis, Routine w reflex microscopic, BLADDER SCAN AMB NON-IMAGING, SUTURE REMOVAL  Postop check - Plan: Urinalysis, Routine w reflex microscopic, BLADDER SCAN AMB NON-IMAGING, SUTURE REMOVAL  Persistent microscopic hematuria  We reviewed the operative procedures and findings. Pre-operative symptoms are >50% improved since the procedure.  We discussed possible future repeat office cystoscopy or CT for ongoing surveillance of persistent microscopic hematuria s/p negative workup for that in December 2022 by Dr. Pete Glatter; will consult Dr. Ronne Binning to see what he suggests.  Will plan for follow up in 6 months or sooner if needed. Pt verbalized understanding and agreement. All questions were answered.   PLAN: Return in about 6 months (around 03/26/2024) for UA, PVR, & f/u with Evette Georges NP.  Orders Placed This Encounter  Procedures   Urinalysis, Routine w reflex microscopic   SUTURE REMOVAL   BLADDER SCAN AMB NON-IMAGING    It has been explained that the patient is to follow regularly with their PCP in addition to all other providers involved in their care and to follow instructions provided by these respective offices. Patient advised to contact urology clinic if any urologic-pertaining questions, concerns, new symptoms or problems arise in the interim period.  There are no Patient Instructions on file for this visit.  Electronically  signed by:  Donnita Falls, MSN, FNP-C, CUNP 09/26/2023 11:40 AM

## 2023-09-23 DIAGNOSIS — J410 Simple chronic bronchitis: Secondary | ICD-10-CM | POA: Diagnosis not present

## 2023-09-23 DIAGNOSIS — E876 Hypokalemia: Secondary | ICD-10-CM | POA: Diagnosis not present

## 2023-09-23 DIAGNOSIS — K219 Gastro-esophageal reflux disease without esophagitis: Secondary | ICD-10-CM | POA: Diagnosis not present

## 2023-09-23 DIAGNOSIS — Z299 Encounter for prophylactic measures, unspecified: Secondary | ICD-10-CM | POA: Diagnosis not present

## 2023-09-26 ENCOUNTER — Ambulatory Visit (INDEPENDENT_AMBULATORY_CARE_PROVIDER_SITE_OTHER): Payer: 59 | Admitting: Urology

## 2023-09-26 VITALS — BP 114/79 | HR 108

## 2023-09-26 DIAGNOSIS — Z960 Presence of urogenital implants: Secondary | ICD-10-CM

## 2023-09-26 DIAGNOSIS — N3941 Urge incontinence: Secondary | ICD-10-CM

## 2023-09-26 DIAGNOSIS — Z09 Encounter for follow-up examination after completed treatment for conditions other than malignant neoplasm: Secondary | ICD-10-CM | POA: Diagnosis not present

## 2023-09-26 DIAGNOSIS — R3129 Other microscopic hematuria: Secondary | ICD-10-CM

## 2023-09-26 DIAGNOSIS — N3281 Overactive bladder: Secondary | ICD-10-CM | POA: Diagnosis not present

## 2023-09-26 LAB — MICROSCOPIC EXAMINATION: Bacteria, UA: NONE SEEN

## 2023-09-26 LAB — URINALYSIS, ROUTINE W REFLEX MICROSCOPIC
Bilirubin, UA: NEGATIVE
Leukocytes,UA: NEGATIVE
Nitrite, UA: NEGATIVE
Specific Gravity, UA: 1.02 (ref 1.005–1.030)
Urobilinogen, Ur: 1 mg/dL (ref 0.2–1.0)
pH, UA: 6 (ref 5.0–7.5)

## 2023-09-26 LAB — BLADDER SCAN AMB NON-IMAGING: Scan Result: 0

## 2023-09-26 NOTE — Progress Notes (Signed)
post void residual = 0 ml

## 2023-09-27 ENCOUNTER — Other Ambulatory Visit: Payer: Self-pay | Admitting: Urology

## 2023-09-27 ENCOUNTER — Telehealth: Payer: Self-pay

## 2023-09-27 DIAGNOSIS — R3129 Other microscopic hematuria: Secondary | ICD-10-CM

## 2023-09-27 NOTE — Telephone Encounter (Signed)
Patient was made aware and voiced understanding.

## 2023-09-27 NOTE — Telephone Encounter (Signed)
-----   Message from Donnita Falls sent at 09/27/2023  8:40 AM EST ----- Please let pt know that Dr. Ronne Binning was consulted about her persistent microscopic hematuria and advised renal US for surveillance. Order placed for that to be repeated prior to her f/u appt in 6 months; please advise patient on how to schedule that before her appt. Thanks. ----- Message ----- From: Malen Gauze, MD Sent: 09/27/2023   8:31 AM EST To: Donnita Falls, FNP  Just renal US in 6-12 months ----- Message ----- From: Donnita Falls, FNP Sent: 09/26/2023  11:39 AM EST To: Malen Gauze, MD  She is doing well s/p InterStim procedure.   Question:  She has persistent microscopic hematuria s/p negative workup for that in December 2022 by Dr. Pete Glatter. Normal RUS on 06/27/2023. Do you suggest repeat office cystoscopy or CT for ongoing surveillance at some point? She is a non-smoker.   Thanks

## 2023-09-29 ENCOUNTER — Other Ambulatory Visit: Payer: Self-pay | Admitting: Student

## 2023-09-29 DIAGNOSIS — Z0000 Encounter for general adult medical examination without abnormal findings: Secondary | ICD-10-CM

## 2023-09-29 NOTE — Telephone Encounter (Signed)
CT scan ordered

## 2023-10-06 ENCOUNTER — Ambulatory Visit (HOSPITAL_COMMUNITY): Payer: 59

## 2023-10-21 ENCOUNTER — Ambulatory Visit: Payer: 59

## 2023-10-25 ENCOUNTER — Ambulatory Visit (HOSPITAL_COMMUNITY)
Admission: RE | Admit: 2023-10-25 | Discharge: 2023-10-25 | Disposition: A | Discharge status: Home / Self Care | Payer: Medicare Other | Source: Ambulatory Visit | Attending: Pulmonary Disease | Admitting: Pulmonary Disease

## 2023-10-25 DIAGNOSIS — R911 Solitary pulmonary nodule: Secondary | ICD-10-CM | POA: Diagnosis present

## 2023-11-02 NOTE — Progress Notes (Signed)
FYI - Seeing you in f/u tomorrow

## 2023-11-03 ENCOUNTER — Encounter: Payer: Self-pay | Admitting: Emergency Medicine

## 2023-11-03 ENCOUNTER — Ambulatory Visit: Payer: Medicare Other | Admitting: Emergency Medicine

## 2023-11-03 VITALS — BP 120/66 | HR 96 | Ht 62.0 in | Wt 122.8 lb

## 2023-11-03 DIAGNOSIS — F17290 Nicotine dependence, other tobacco product, uncomplicated: Secondary | ICD-10-CM | POA: Diagnosis not present

## 2023-11-03 DIAGNOSIS — R918 Other nonspecific abnormal finding of lung field: Secondary | ICD-10-CM

## 2023-11-03 DIAGNOSIS — Z720 Tobacco use: Secondary | ICD-10-CM | POA: Diagnosis not present

## 2023-11-03 NOTE — Assessment & Plan Note (Signed)
We reviewed your CT scan from 10/25/2023.  This was a reassuring study.  Your pulmonary nodule is unchanged for over 2 years. We will refer you to the lung cancer screening program.  Your first CT scan for screening would be in January 2026 Follow Dr. Delton Coombes in January 2026 after your CT so we can review those results together.  Please call sooner if you have any problems.

## 2023-11-03 NOTE — Assessment & Plan Note (Signed)
Suspect she has some mild COPD but she is not yet symptomatic.  Will hold off on BD therapy.  Most beneficial intervention at this time will be smoking cessation.  Discussed this with her today.  She will qualify for lung cancer screening and we will make the referral

## 2023-11-03 NOTE — Progress Notes (Signed)
Subjective:    Patient ID: Laura Suarez, female    DOB: [birth year], 58 y.o.   MRN: [id number]  HPI Laura Suarez is a 58 year old woman with a history of former tobacco use (30+ pack yrs).  She has been seen in our office by Dr. Judeth Horn for hemoptysis in 08/2021 and an abnormal CT scan of the chest that showed an 8 mm right lower lobe nodule, stable on initial surveillance imaging. She had quit smoking, but is doing 3-4 cigars a day. No breathing complaints. She does have some cough, clear mucous, happens daily.   Repeat CT scan of the chest done 10/25/2023 reviewed by me showed centrilobular and paraseptal emphysema, no mediastinal or hilar adenopathy, a somewhat spiculated appearing 7 mm nodule in the posterior medial right lower lobe that is unchanged going back to 10/21/2021.    Review of Systems As per HPI  Past Medical History:  Diagnosis Date   Bipolar disorder (HCC)    Depression    Diabetes mellitus without complication (HCC)    High cholesterol      No family history on file.   Social History   Socioeconomic History   Marital status: Married    Spouse name: Not on file   Number of children: Not on file   Years of education: Not on file   Highest education level: Not on file  Occupational History   Not on file  Tobacco Use   Smoking status: Every Day    Types: Cigars   Smokeless tobacco: Never   Tobacco comments:    3 cigars per day   Vaping Use   Vaping status: Never Used  Substance and Sexual Activity   Alcohol use: Not on file   Drug use: Never   Sexual activity: Not on file  Other Topics Concern   Not on file  Social History Narrative   Not on file   Social Drivers of Health   Financial Resource Strain: Low Risk  (01/06/2022)   Received from [facility], [facility]   Overall Financial Resource Strain (CARDIA)    Difficulty of Paying Living Expenses: Not hard at all  Food Insecurity: No Food Insecurity (06/03/2021)   Received from [facility], [facility]   Hunger Vital Sign    Worried About Running Out of Food in the Last Year: Never true    Ran Out of Food in the Last Year: Never true  Transportation Needs: No Transportation Needs (01/06/2022)   Received from [facility], [facility]   [facility] - Transportation    Lack of Transportation (Medical): No    Lack of Transportation (Non-Medical): No  Physical Activity: Insufficiently Active (06/03/2021)   Received from [facility], [facility]   Exercise Vital Sign    Days of Exercise per Week: 4 days    Minutes of Exercise per Session: 30 min  Stress: No Stress Concern Present (06/03/2021)   Received from [facility], [facility] - Occupational Stress Questionnaire    Feeling of Stress : Not at all  Social Connections: Socially Integrated (06/03/2021)   Received from [facility], [facility]   Social Connection and Isolation Panel [NHANES]    Frequency of Communication with Friends and Family: Twice a week    Frequency of Social Gatherings with Friends and Family: Twice a week    Attends Religious Services: 1 to 4 times per  year    Active Member of Clubs or Organizations: No    Attends Banker Meetings: 1 to 4 times per year    Marital Status: Married  Catering manager Violence: Not At Risk (06/03/2021)   Received from [facility], [facility]   Humiliation, Afraid, Rape, and Kick questionnaire    Fear of Current or Ex-Partner: No    Emotionally Abused: No    Physically Abused: No    Sexually Abused: No     Allergies  Allergen Reactions   Sulfa Antibiotics Rash   Ziprasidone Hcl Other (See Comments)    Unable to move limbs       Outpatient Medications Prior to Visit  Medication Sig Dispense Refill   aspirin 81 MG EC tablet Take 81 mg by mouth daily.     atorvastatin (LIPITOR) 40 MG tablet Take 40 mg by mouth at bedtime.     Biotin 5000 MCG CAPS Take 5,000  mcg by mouth daily.     busPIRone (BUSPAR) 10 MG tablet Take 10 mg by mouth 3 (three) times daily.     Coenzyme Q10 100 MG CHEW Chew 100 mg by mouth daily.     FARXIGA 5 MG TABS tablet Take 5 mg by mouth every morning.     hydrOXYzine (ATARAX) 25 MG tablet Take 25 mg by mouth daily as needed for anxiety.     lisinopril (ZESTRIL) 2.5 MG tablet Take 2.5 mg by mouth every morning.     metFORMIN (GLUCOPHAGE) 1000 MG tablet Take 1,000 mg by mouth 2 (two) times daily.     Multiple Vitamin (MULTIVITAMIN WITH MINERALS) TABS tablet Take 1 tablet by mouth daily.     omeprazole (PRILOSEC) 40 MG capsule Take 40 mg by mouth at bedtime.     OXcarbazepine (TRILEPTAL) 150 MG tablet Take 300 mg by mouth at bedtime.     PARoxetine (PAXIL) 40 MG tablet Take 40 mg by mouth daily.     Potassium 99 MG TABS Take 99 mg by mouth in the morning and at bedtime.     Probiotic Product (PROBIOTIC PO) Take 1 tablet by mouth 3 (three) times a week.     risperiDONE (RISPERDAL) 1 MG tablet Take 1-2 mg by mouth See admin instructions. Take 1 mg by mouth in the morning and 2 mg in the evening     traZODone (DESYREL) 150 MG tablet Take 150 mg by mouth at bedtime.     TRESIBA FLEXTOUCH 200 UNIT/ML FlexTouch Pen Inject 26 Units into the skin at bedtime.     amoxicillin-clavulanate (AUGMENTIN) 875-125 MG tablet Take 1 tablet by mouth 2 (two) times daily. (Patient not taking: Reported on 11/03/2023) 14 tablet 0   Apoaequorin (PREVAGEN PO) Take 1 capsule by mouth daily. (Patient not taking: Reported on 11/03/2023)     ferrous sulfate 324 MG TBEC Take 324 mg by mouth 4 (four) times a week.     HYDROcodone-acetaminophen (NORCO) 5-325 MG tablet Take 1 tablet by mouth every 6 (six) hours as needed for moderate pain (pain score 4-6). (Patient not taking: Reported on 11/03/2023) 30 tablet 0   No facility-administered medications prior to visit.        Objective:   Physical Exam Vitals:   11/03/23 1003  BP: 120/66  Pulse: 96  SpO2: 98%   Weight: 122 lb 12.8 oz (55.7 kg)  Height: 5\' 2"  (1.575 m)   Gen: Pleasant, well-nourished, in no distress,  normal affect  ENT: No  lesions,  mouth clear,  oropharynx clear, no postnasal drip  Neck: No JVD, no stridor  Lungs: No use of accessory muscles, no crackles or wheezing on normal respiration, no wheeze on forced expiration  Cardiovascular: RRR, heart sounds normal, no murmur or gallops, no peripheral edema  Musculoskeletal: No deformities, no cyanosis or clubbing  Neuro: alert, awake, non focal  Skin: Warm, no lesions or rash       Assessment & Plan:  Pulmonary nodules We reviewed your CT scan from 10/25/2023.  This was a reassuring study.  Your pulmonary nodule is unchanged for over 2 years. We will refer you to the lung cancer screening program.  Your first CT scan for screening would be in January 2026 Follow Dr. Delton Coombes in January 2026 after your CT so we can review those results together.  Please call sooner if you have any problems.  Tobacco use Suspect she has some mild COPD but she is not yet symptomatic.  Will hold off on BD therapy.  Most beneficial intervention at this time will be smoking cessation.  Discussed this with her today.  She will qualify for lung cancer screening and we will make the referral   Levy Pupa, MD, PhD 11/03/2023, 7:42 PM [facility] [phone] or if no answer before 7:00PM call [phone] For any issues after 7:00PM please call eLink [phone]

## 2023-11-03 NOTE — Patient Instructions (Signed)
We reviewed your CT scan from 10/25/2023.  This was a reassuring study.  Your pulmonary nodule is unchanged for over 2 years. We will not start any inhaled medication right now. Please work hard on decreasing your smoking.  Ultimate goal will be to stop altogether. We will refer you to the lung cancer screening program.  Your first CT scan for screening would be in January 2026 Follow Dr. Delton Coombes in January 2026 after your CT so we can review those results together.  Please call sooner if you have any problems.

## 2023-11-09 ENCOUNTER — Ambulatory Visit: Payer: 59

## 2023-11-30 ENCOUNTER — Ambulatory Visit: Payer: 59

## 2023-12-22 ENCOUNTER — Ambulatory Visit
Admission: RE | Admit: 2023-12-22 | Discharge: 2023-12-22 | Disposition: A | Discharge status: Home / Self Care | Payer: Medicare Other | Source: Ambulatory Visit | Attending: Student | Admitting: Student

## 2023-12-22 DIAGNOSIS — Z0000 Encounter for general adult medical examination without abnormal findings: Secondary | ICD-10-CM

## 2023-12-27 ENCOUNTER — Other Ambulatory Visit: Payer: Self-pay | Admitting: Student

## 2023-12-27 DIAGNOSIS — R928 Other abnormal and inconclusive findings on diagnostic imaging of breast: Secondary | ICD-10-CM

## 2024-01-11 ENCOUNTER — Encounter

## 2024-01-13 ENCOUNTER — Ambulatory Visit
Admission: RE | Admit: 2024-01-13 | Discharge: 2024-01-13 | Disposition: A | Discharge status: Home / Self Care | Source: Ambulatory Visit | Attending: Student | Admitting: Student

## 2024-01-13 DIAGNOSIS — R921 Mammographic calcification found on diagnostic imaging of breast: Secondary | ICD-10-CM | POA: Diagnosis not present

## 2024-01-13 DIAGNOSIS — R928 Other abnormal and inconclusive findings on diagnostic imaging of breast: Secondary | ICD-10-CM

## 2024-02-07 DIAGNOSIS — B351 Tinea unguium: Secondary | ICD-10-CM | POA: Diagnosis not present

## 2024-02-07 DIAGNOSIS — M79675 Pain in left toe(s): Secondary | ICD-10-CM | POA: Diagnosis not present

## 2024-02-07 DIAGNOSIS — E1142 Type 2 diabetes mellitus with diabetic polyneuropathy: Secondary | ICD-10-CM | POA: Diagnosis not present

## 2024-02-07 DIAGNOSIS — M79674 Pain in right toe(s): Secondary | ICD-10-CM | POA: Diagnosis not present

## 2024-02-07 DIAGNOSIS — L84 Corns and callosities: Secondary | ICD-10-CM | POA: Diagnosis not present

## 2024-03-15 DIAGNOSIS — Z960 Presence of urogenital implants: Secondary | ICD-10-CM | POA: Insufficient documentation

## 2024-03-15 NOTE — Progress Notes (Deleted)
 Name: Laura Suarez DOB: 1965-12-11 MRN: [id number]  History of Present Illness: Laura Suarez is a 58 y.o. female who presents today for follow up visit at [facility].  Relevant History includes: 1. OAB with urge incontinence. - 09/13/2023: InterStim stage 2 implantation by Dr. Claretta Croft. 2. 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 - 09/15/2021: CT hematuria protocol  - 09/21/2021: Cystoscopy by Dr. Willye Harvey was unremarkable.   At last visit on 09/26/2023: Doing well. Dr. Claretta Croft consulted about her persistent microscopic hematuria and advised renal US  for surveillance in 6 months.  Since last visit: ***RUS  Today: She reports ***  She reports {Blank multiple:19197::"improved","persistent / unchanged"} urinary ***frequency, ***nocturia x***, ***urgency, and ***urge incontinence.  Voiding ***x/day and ***x/night on average.  Leaking ***x/day on average; using *** ***pads / ***diapers per day on average.  She {Actions; denies-reports:120008} significant caffeine intake (*** caffeinated beverages per day on average).  She {Actions; denies-reports:120008} dysuria, gross hematuria, straining to void, or sensations of incomplete emptying.  Medications: Current Outpatient Medications  Medication Sig Dispense Refill   amoxicillin -clavulanate (AUGMENTIN ) 875-125 MG tablet Take 1 tablet by mouth 2 (two) times daily. (Patient not taking: Reported on 11/03/2023) 14 tablet 0   Apoaequorin (PREVAGEN PO) Take 1 capsule by mouth daily. (Patient not taking: Reported on 11/03/2023)     aspirin 81 MG EC tablet Take 81 mg by mouth daily.     atorvastatin (LIPITOR) 40 MG tablet Take 40 mg by mouth at bedtime.     Biotin 5000 MCG CAPS Take 5,000 mcg by mouth daily.     busPIRone (BUSPAR) 10 MG tablet Take 10 mg by mouth 3 (three) times daily.     Coenzyme Q10 100 MG CHEW Chew 100 mg by mouth daily.     FARXIGA 5 MG TABS tablet Take 5 mg by mouth every morning.      ferrous sulfate 324 MG TBEC Take 324 mg by mouth 4 (four) times a week.     HYDROcodone -acetaminophen  (NORCO) 5-325 MG tablet Take 1 tablet by mouth every 6 (six) hours as needed for moderate pain (pain score 4-6). (Patient not taking: Reported on 11/03/2023) 30 tablet 0   hydrOXYzine (ATARAX) 25 MG tablet Take 25 mg by mouth daily as needed for anxiety.     lisinopril (ZESTRIL) 2.5 MG tablet Take 2.5 mg by mouth every morning.     metFORMIN (GLUCOPHAGE) 1000 MG tablet Take 1,000 mg by mouth 2 (two) times daily.     Multiple Vitamin (MULTIVITAMIN WITH MINERALS) TABS tablet Take 1 tablet by mouth daily.     omeprazole (PRILOSEC) 40 MG capsule Take 40 mg by mouth at bedtime.     OXcarbazepine (TRILEPTAL) 150 MG tablet Take 300 mg by mouth at bedtime.     PARoxetine (PAXIL) 40 MG tablet Take 40 mg by mouth daily.     Potassium 99 MG TABS Take 99 mg by mouth in the morning and at bedtime.     Probiotic Product (PROBIOTIC PO) Take 1 tablet by mouth 3 (three) times a week.     risperiDONE (RISPERDAL) 1 MG tablet Take 1-2 mg by mouth See admin instructions. Take 1 mg by mouth in the morning and 2 mg in the evening     traZODone (DESYREL) 150 MG tablet Take 150 mg by mouth at bedtime.     TRESIBA FLEXTOUCH 200 UNIT/ML FlexTouch Pen Inject 26 Units into the skin at bedtime.     No current facility-administered medications for this  visit.    Allergies: Allergies  Allergen Reactions   Sulfa Antibiotics Rash   Ziprasidone Hcl Other (See Comments)    Unable to move limbs      Past Medical History:  Diagnosis Date   Bipolar disorder (HCC)    Depression    Diabetes mellitus without complication (HCC)    High cholesterol    Past Surgical History:  Procedure Laterality Date   ABDOMINAL HYSTERECTOMY     BIOPSY  05/16/2023   Procedure: BIOPSY;  Surgeon: Hargis Lias, MD;  Location: [facility];  Service: Endoscopy;;   COLONOSCOPY WITH PROPOFOL  N/A 05/16/2023   Procedure: COLONOSCOPY WITH  PROPOFOL ;  Surgeon: Hargis Lias, MD;  Location: [facility];  Service: Endoscopy;  Laterality: N/A;  7:30AM;ASA 2   INTERSTIM IMPLANT PLACEMENT N/A 08/01/2023   Procedure: Simona [place] IMPLANT FIRST STAGE;  Surgeon: Marco Severs, MD;  Location: [facility];  Service: Urology;  Laterality: N/A;   INTERSTIM IMPLANT PLACEMENT N/A 09/13/2023   Procedure: Simona [place] IMPLANT SECOND STAGE;  Surgeon: Marco Severs, MD;  Location: [facility];  Service: Urology;  Laterality: N/A;   No family history on file. Social History   Socioeconomic History   Marital status: Married    Spouse name: Not on file   Number of children: Not on file   Years of education: Not on file   Highest education level: Not on file  Occupational History   Not on file  Tobacco Use   Smoking status: Every Day    Types: Cigars   Smokeless tobacco: Never   Tobacco comments:    3 cigars per day   Vaping Use   Vaping status: Never Used  Substance and Sexual Activity   Alcohol use: Not on file   Drug use: Never   Sexual activity: Not on file  Other Topics Concern   Not on file  Social History Narrative   Not on file   Social Drivers of Health   Financial Resource Strain: Low Risk  (01/06/2022)   Received from [facility], [facility]   Overall Financial Resource Strain (CARDIA)    Difficulty of Paying Living Expenses: Not hard at all  Food Insecurity: No Food Insecurity (06/03/2021)   Received from [facility], [facility]   Hunger Vital Sign    Worried About Running Out of Food in the Last Year: Never true    Ran Out of Food in the Last Year: Never true  Transportation Needs: No Transportation Needs (01/06/2022)   Received from [facility], [facility]   [facility]    Lack of Transportation (Medical): No    Lack of Transportation (Non-Medical): No  Physical Activity: Insufficiently Active (06/03/2021)   Received from [facility], [facility]   Exercise Vital  Sign    Days of Exercise per Week: 4 days    Minutes of Exercise per Session: 30 min  Stress: No Stress Concern Present (06/03/2021)   Received from [facility], [facility] - Occupational Stress Questionnaire    Feeling of Stress : Not at all  Social Connections: Socially Integrated (06/03/2021)   Received from [facility], [facility]   Social Connection and Isolation Panel [NHANES]    Frequency of Communication with Friends and Family: Twice a week    Frequency of Social Gatherings with Friends and Family: Twice a week  Attends Religious Services: 1 to 4 times per year    Active Member of Clubs or Organizations: No    Attends Banker Meetings: 1 to 4 times per year    Marital Status: Married  Catering manager Violence: Not At Risk (06/03/2021)   Received from [facility], [facility]   Humiliation, Afraid, Rape, and Kick questionnaire    Fear of Current or Ex-Partner: No    Emotionally Abused: No    Physically Abused: No    Sexually Abused: No    Review of Systems Constitutional: Patient denies any unintentional weight loss or change in strength lntegumentary: Patient denies any rashes or pruritus Eyes: Patient {Actions; denies-reports:120008} dry eyes ENT: Patient {Actions; denies-reports:120008} dry mouth Cardiovascular: Patient denies chest pain or syncope Respiratory: Patient denies shortness of breath Gastrointestinal: Patient {Actions; denies-reports:120008} constipation Musculoskeletal: Patient denies muscle cramps or weakness Neurologic: Patient denies convulsions or seizures Allergic/Immunologic: Patient denies recent allergic reaction(s) Hematologic/Lymphatic: Patient denies bleeding tendencies Endocrine: Patient denies heat/cold intolerance  GU: As per HPI.  OBJECTIVE There were no vitals filed for this visit. There is no height or weight on file to calculate BMI.  Physical  Examination Constitutional: No obvious distress; patient is non-toxic appearing  Cardiovascular: No visible lower extremity edema.  Respiratory: The patient does not have audible wheezing/stridor; respirations do not appear labored  Gastrointestinal: Abdomen non-distended Musculoskeletal: Normal ROM of UEs  Skin: No obvious rashes/open sores  Neurologic: CN 2-12 grossly intact Psychiatric: Answered questions appropriately with normal affect  Hematologic/Lymphatic/Immunologic: No obvious bruises or sites of spontaneous bleeding  UA: ***negative ***positive for *** leukocytes, *** blood, ***nitrites Urine microscopy: *** WBC/hpf, *** RBC/hpf, *** bacteria ***glucosuria (secondary to ***Jardiance ***Farxiga use) ***otherwise unremarkable  PVR: *** ml  ASSESSMENT No diagnosis found.  We discussed the symptoms of overactive bladder (OAB), which include urinary urgency, frequency, nocturia, with or without urge incontinence.  While we may not know the exact etiology of OAB, several risk factors can be identified.  - Patient's neurogenic risk factors: ***T2DM ***with neuropathy, ***nicotine use, ***spinal stenosis, ***prior stroke, ***dementia.  - Patient's exacerbating factors include: ***diuretic use, ***caffeine intake, ***glucosuria (due to ***Jardiance / ***Farxiga use), ***ambulatory dysfunction (functional incontinence).   We discussed the following management options in detail including potential benefits, risks, and side effects: Behavioral therapy: Modify fluid intake Minimize / avoid bladder irritants (such as caffeine, spicy foods, acidic foods, alcohol) Bladder retraining / timed voiding Double voiding Medication(s): ***- We discussed potential side effects of anticholinergic medications such as urinary retention, dry eyes, dry mouth, constipation, confusion, cognitive impairment / dementia.  ***- Not a safe candidate for anticholinergic medications due to risk for side  effects based on patient's age, comorbidities, and pre-existing ***dry mouth ***dry eyes ***constipation ***dementia ***Parkinsons disease ***MS.  ***- Beta-3 agonist medications: We discussed potential side effects of beta-3 agonist medications such as urinary retention and (infrequently) elevated blood pressure.  ***- Combination therapy with anticholinergic medication + beta-3 agonist medication. 3. For refractory cases: PTNS (posterior tibial nerve stimulation) ***Not a safe candidate for PTNS due to ***bleeding disorder, ***anticoagulant use, ***pregnancy, ***pacemaker, ***implanted cardiac defibrillator (ICD), ***neuropathy / nerve damage / nerve conduction disorder, ***lower extremity metal implant(s).  Sacral neuromodulation trial (Medtronic lnterStim or Axonics implant) Bladder Botox injections  ***Consider discussing possible alternatives to ***Jardiance ***Farxiga with prescribing provider. This medication causes excess sugar to be excreted into the urine. That can prompt the kidneys to put out more water  to dilute that sugar  in the urine and it can also irritate the bladder lining, both of which may contribute to OAB symptoms (urinary frequency, urgency, and urge incontinence).  She decided to proceed with *** ***behavioral modifications including ***minimizing / avoiding caffeine intake and working on ***timed voiding / bladder retraining.  Will plan for follow up in *** weeks / *** months or sooner if needed. Pt verbalized understanding and agreement. All questions were answered.  PLAN Advised the following: ***. *** ***. Minimize / avoid caffeine intake. ***. Work on timed voiding / bladder retraining. ***. No follow-ups on file.  No orders of the defined types were placed in this encounter.   It has been explained that the patient is to follow regularly with their PCP in addition to all other providers involved in their care and to follow instructions provided by these  respective offices. Patient advised to contact urology clinic if any urologic-pertaining questions, concerns, new symptoms or problems arise in the interim period.  There are no Patient Instructions on file for this visit.  Electronically signed by:  Lauretta Ponto, FNP   03/15/24    1:13 PM

## 2024-03-22 DIAGNOSIS — E1169 Type 2 diabetes mellitus with other specified complication: Secondary | ICD-10-CM | POA: Diagnosis not present

## 2024-03-22 DIAGNOSIS — E1165 Type 2 diabetes mellitus with hyperglycemia: Secondary | ICD-10-CM | POA: Diagnosis not present

## 2024-03-22 DIAGNOSIS — Z299 Encounter for prophylactic measures, unspecified: Secondary | ICD-10-CM | POA: Diagnosis not present

## 2024-03-26 ENCOUNTER — Ambulatory Visit: Payer: 59 | Admitting: Urology

## 2024-03-27 ENCOUNTER — Ambulatory Visit (HOSPITAL_COMMUNITY)
Admission: RE | Admit: 2024-03-27 | Discharge: 2024-03-27 | Disposition: A | Discharge status: Home / Self Care | Payer: 59 | Source: Ambulatory Visit | Attending: Urology | Admitting: Urology

## 2024-03-27 DIAGNOSIS — R3129 Other microscopic hematuria: Secondary | ICD-10-CM | POA: Diagnosis not present

## 2024-03-28 ENCOUNTER — Ambulatory Visit: Payer: Self-pay | Admitting: Urology

## 2024-03-28 NOTE — Telephone Encounter (Signed)
 Patient was made aware and voiced understanding.

## 2024-03-28 NOTE — Telephone Encounter (Signed)
-----   Message from Lauretta Ponto sent at 03/28/2024  3:45 PM EDT ----- Please let pt know RUS was normal - no stones, masses, or hydronephrosis. Thanks.  ----- Message ----- From: Interface, Rad Results In Sent: 03/28/2024   8:25 AM EDT To: Sarah C Larocco, FNP

## 2024-04-17 DIAGNOSIS — M79675 Pain in left toe(s): Secondary | ICD-10-CM | POA: Diagnosis not present

## 2024-04-17 DIAGNOSIS — B351 Tinea unguium: Secondary | ICD-10-CM | POA: Diagnosis not present

## 2024-04-17 DIAGNOSIS — E1142 Type 2 diabetes mellitus with diabetic polyneuropathy: Secondary | ICD-10-CM | POA: Diagnosis not present

## 2024-04-17 DIAGNOSIS — M79674 Pain in right toe(s): Secondary | ICD-10-CM | POA: Diagnosis not present

## 2024-04-17 DIAGNOSIS — L84 Corns and callosities: Secondary | ICD-10-CM | POA: Diagnosis not present

## 2024-04-18 ENCOUNTER — Encounter: Payer: Self-pay | Admitting: Urology

## 2024-04-18 ENCOUNTER — Ambulatory Visit: Admitting: Urology

## 2024-04-18 VITALS — BP 113/75 | HR 111

## 2024-04-18 DIAGNOSIS — R3129 Other microscopic hematuria: Secondary | ICD-10-CM

## 2024-04-18 DIAGNOSIS — N3941 Urge incontinence: Secondary | ICD-10-CM | POA: Diagnosis not present

## 2024-04-18 DIAGNOSIS — N3281 Overactive bladder: Secondary | ICD-10-CM

## 2024-04-18 DIAGNOSIS — Z720 Tobacco use: Secondary | ICD-10-CM

## 2024-04-18 LAB — URINALYSIS, ROUTINE W REFLEX MICROSCOPIC
Bilirubin, UA: NEGATIVE
Ketones, UA: NEGATIVE
Leukocytes,UA: NEGATIVE
Nitrite, UA: NEGATIVE
Protein,UA: NEGATIVE
Specific Gravity, UA: <1.005 — ABNORMAL LOW (ref 1.005–1.030)
Urobilinogen, Ur: 0.2 mg/dL (ref 0.2–1.0)
pH, UA: 6.5 (ref 5.0–7.5)

## 2024-04-18 LAB — MICROSCOPIC EXAMINATION
Bacteria, UA: NONE SEEN
WBC, UA: NONE SEEN /HPF (ref 0–5)

## 2024-04-18 LAB — BLADDER SCAN AMB NON-IMAGING: Scan Result: 54

## 2024-04-18 NOTE — Progress Notes (Signed)
 Name: Laura Suarez DOB: 26-Nov-1965 MRN: [id number]  History of Present Illness: Ms. Capano is a 58 y.o. female who presents today for follow up visit at [facility].  Relevant History includes: 1. OAB with urge incontinence. - 09/13/2023: InterStim stage 2 implantation by Dr. Sherrilee. 2. 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 - 09/15/2021: CT hematuria protocol.  - 09/21/2021: Cystoscopy by Dr. Roseann was unremarkable.   At last visit on 09/26/2023: Doing well. Dr. Sherrilee consulted about her persistent microscopic hematuria and advised renal US  for surveillance in 6 months.  Since last visit: - 03/27/2024: RUS was normal with no GU stones, masses, or hydronephrosis.   Today: She reports that her urinary symptoms continue to be well managed with sacral neuromodulation therapy via her InterStim device. She denies bothersome frequency, nocturia, urgency, or urge incontinence. She reports intermittent discomfort at the battery implant site on her right buttock which she describes as a brief sharp stinging pain; also has some itching at times in that area.    Medications: Current Outpatient Medications  Medication Sig Dispense Refill   amoxicillin -clavulanate (AUGMENTIN ) 875-125 MG tablet Take 1 tablet by mouth 2 (two) times daily. 14 tablet 0   Apoaequorin (PREVAGEN PO) Take 1 capsule by mouth daily.     aspirin 81 MG EC tablet Take 81 mg by mouth daily.     atorvastatin (LIPITOR) 40 MG tablet Take 40 mg by mouth at bedtime.     Biotin 5000 MCG CAPS Take 5,000 mcg by mouth daily.     busPIRone (BUSPAR) 10 MG tablet Take 10 mg by mouth 3 (three) times daily.     Coenzyme Q10 100 MG CHEW Chew 100 mg by mouth daily.     FARXIGA 5 MG TABS tablet Take 5 mg by mouth every morning.     hydrOXYzine (ATARAX) 25 MG tablet Take 25 mg by mouth daily as needed for anxiety.     lisinopril (ZESTRIL) 2.5 MG tablet Take 2.5 mg by mouth every morning.      metFORMIN (GLUCOPHAGE) 1000 MG tablet Take 1,000 mg by mouth 2 (two) times daily.     Multiple Vitamin (MULTIVITAMIN WITH MINERALS) TABS tablet Take 1 tablet by mouth daily.     omeprazole (PRILOSEC) 40 MG capsule Take 40 mg by mouth at bedtime.     OXcarbazepine (TRILEPTAL) 150 MG tablet Take 300 mg by mouth at bedtime.     PARoxetine (PAXIL) 40 MG tablet Take 40 mg by mouth daily.     Potassium 99 MG TABS Take 99 mg by mouth in the morning and at bedtime.     Probiotic Product (PROBIOTIC PO) Take 1 tablet by mouth 3 (three) times a week.     risperiDONE (RISPERDAL) 1 MG tablet Take 1-2 mg by mouth See admin instructions. Take 1 mg by mouth in the morning and 2 mg in the evening     traZODone (DESYREL) 150 MG tablet Take 150 mg by mouth at bedtime.     TRESIBA FLEXTOUCH 200 UNIT/ML FlexTouch Pen Inject 26 Units into the skin at bedtime.     ferrous sulfate 324 MG TBEC Take 324 mg by mouth 4 (four) times a week.     HYDROcodone -acetaminophen  (NORCO) 5-325 MG tablet Take 1 tablet by mouth every 6 (six) hours as needed for moderate pain (pain score 4-6). (Patient not taking: Reported on 04/18/2024) 30 tablet 0   No current facility-administered medications for this visit.    Allergies: Allergies  Allergen Reactions   Sulfa Antibiotics Rash   Ziprasidone Hcl Other (See Comments)    Unable to move limbs      Past Medical History:  Diagnosis Date   Bipolar disorder (HCC)    Depression    Diabetes mellitus without complication (HCC)    High cholesterol    Past Surgical History:  Procedure Laterality Date   ABDOMINAL HYSTERECTOMY     BIOPSY  05/16/2023   Procedure: BIOPSY;  Surgeon: Cinderella Deatrice FALCON, MD;  [facility];  Service: Endoscopy;;   COLONOSCOPY WITH PROPOFOL  N/A 05/16/2023   Procedure: COLONOSCOPY WITH PROPOFOL ;  Surgeon: Cinderella Deatrice FALCON, MD;  [facility];  Service: Endoscopy;  Laterality: N/A;  7:30AM;ASA 2   INTERSTIM IMPLANT PLACEMENT N/A 08/01/2023    Procedure: RENNA IMPLANT FIRST STAGE;  Surgeon: Sherrilee Belvie CROME, MD;  Location: [facility];  Service: Urology;  Laterality: N/A;   INTERSTIM IMPLANT PLACEMENT N/A 09/13/2023   Procedure: RENNA IMPLANT SECOND STAGE;  Surgeon: Sherrilee Belvie CROME, MD;  Location: [facility];  Service: Urology;  Laterality: N/A;   History reviewed. No pertinent family history. Social History   Socioeconomic History   Marital status: Married    Spouse name: Not on file   Number of children: Not on file   Years of education: Not on file   Highest education level: Not on file  Occupational History   Not on file  Tobacco Use   Smoking status: Every Day    Types: Cigars   Smokeless tobacco: Never   Tobacco comments:    3 cigars per day   Vaping Use   Vaping status: Never Used  Substance and Sexual Activity   Alcohol use: Not on file   Drug use: Never   Sexual activity: Not on file  Other Topics Concern   Not on file  Social History Narrative   Not on file   Social Drivers of Health   Financial Resource Strain: Low Risk  (01/06/2022)   Received from [facility]   Overall Financial Resource Strain (CARDIA)    Difficulty of Paying Living Expenses: Not hard at all  Food Insecurity: No Food Insecurity (06/03/2021)   Received from [facility]   Hunger Vital Sign    Within the past 12 months, you worried that your food would run out before you got the money to buy more.: Never true    Within the past 12 months, the food you bought just didn't last and you didn't have money to get more.: Never true  Transportation Needs: No Transportation Needs (01/06/2022)   Received from [facility]   PRAPARE - Transportation    Lack of Transportation (Medical): No    Lack of Transportation (Non-Medical): No  Physical Activity: Insufficiently Active (06/03/2021)   Received from [facility]   Exercise Vital Sign    On average, how many days per week do you engage in moderate to strenuous exercise  (like a brisk walk)?: 4 days    On average, how many minutes do you engage in exercise at this level?: 30 min  Stress: No Stress Concern Present (06/03/2021)   Received from [facility] - Occupational Stress Questionnaire    Feeling of Stress : Not at all  Social Connections: Socially Integrated (06/03/2021)   Received from [facility]   Social Connection and Isolation Panel    In a typical week, how many times  do you talk on the phone with family, friends, or neighbors?: Twice a week    How often do you get together with friends or relatives?: Twice a week    How often do you attend church or religious services?: 1 to 4 times per year    Do you belong to any clubs or organizations such as church groups, unions, fraternal or athletic groups, or school groups?: No    How often do you attend meetings of the clubs or organizations you belong to?: 1 to 4 times per year    Are you married, widowed, divorced, separated, never married, or living with a partner?: Married  Intimate Partner Violence: Not At Risk (06/03/2021)   Received from [facility]   Humiliation, Afraid, Rape, and Kick questionnaire    Within the last year, have you been afraid of your partner or ex-partner?: No    Within the last year, have you been humiliated or emotionally abused in other ways by your partner or ex-partner?: No    Within the last year, have you been kicked, hit, slapped, or otherwise physically hurt by your partner or ex-partner?: No    Within the last year, have you been raped or forced to have any kind of sexual activity by your partner or ex-partner?: No    Review of Systems Constitutional: Patient denies any unintentional weight loss or change in strength lntegumentary: As per HPI Cardiovascular: Patient denies chest pain or syncope Respiratory: Patient denies shortness of breath Gastrointestinal: Patient denies constipation Musculoskeletal: Patient  denies muscle cramps or weakness Neurologic: Patient denies convulsions or seizures Allergic/Immunologic: Patient denies recent allergic reaction(s) Hematologic/Lymphatic: Patient denies bleeding tendencies Endocrine: Patient denies heat/cold intolerance  GU: As per HPI.  OBJECTIVE Vitals:   04/18/24 1402  BP: 113/75  Pulse: (!) 111   There is no height or weight on file to calculate BMI.  Physical Examination Constitutional: No obvious distress; patient is non-toxic appearing  Cardiovascular: No visible lower extremity edema.  Respiratory: The patient does not have audible wheezing/stridor; respirations do not appear labored  Gastrointestinal: Abdomen non-distended Musculoskeletal: Normal ROM of UEs  Skin: No obvious rashes/open sores. The InterStim battery implant site on right buttock is well healed and intact with no induration, edema, discharge, fluctuance, crepitus, erythema, warmth, or tenderness to palpation.  Neurologic: CN 2-12 grossly intact Psychiatric: Answered questions appropriately with normal affect  Hematologic/Lymphatic/Immunologic: No obvious bruises or sites of spontaneous bleeding  UA: glucose 1+, blood 1+ Urine microscopy: unremarkable (0-2 RBC/hpf)  PVR: 54 ml  ASSESSMENT OAB (overactive bladder) - Plan: Urinalysis, Routine w reflex microscopic, BLADDER SCAN AMB NON-IMAGING  Urge incontinence of urine - Plan: Urinalysis, Routine w reflex microscopic, BLADDER SCAN AMB NON-IMAGING  Persistent microscopic hematuria - Plan: Urinalysis, Routine w reflex microscopic, BLADDER SCAN AMB NON-IMAGING, US  RENAL  Tobacco use - Plan: US  RENAL  No acute findings. We discussed slow healing nerve irritation related to her surgery as the suspected cause for her intermittent right buttock incisional pain, which is expected to resolve in time. She was advised to trial turning her sacral neuromodulation off completely for 24-48 hours to see if that makes any difference,  which will help differentiate if the stimulation itself plays any role. Offered to assess her programming today however she expressed confidence and comfort with self-programming therefore we agreed to forgo in-office programming check today. We agreed to plan for follow up in 1 year with RUS for surveillance due to her persistent idiopathic microscopic hematuria  with history of smoking (or sooner if needed). Pt verbalized understanding and agreement. All questions were answered.  PLAN Advised the following: 1. Return for OAB & chronic microhematuria, with UA & PVR, will need RUS prior.  Orders Placed This Encounter  Procedures   US  RENAL    Standing Status:   Future    Expected Date:   04/18/2025    Expiration Date:   04/18/2025    Reason for Exam (SYMPTOM  OR DIAGNOSIS REQUIRED):   kidney stone known or suspected    Preferred imaging location?:   [facility]   Urinalysis, Routine w reflex microscopic   BLADDER SCAN AMB NON-IMAGING   Total time spent caring for the patient today was over 30 minutes. This includes time spent on the date of the visit reviewing the patient's chart before the visit, time spent during the visit, and time spent after the visit on documentation. Over 50% of that time was spent in face-to-face time with this patient for direct counseling. E&M based on time and complexity of medical decision making.  It has been explained that the patient is to follow regularly with their PCP in addition to all other providers involved in their care and to follow instructions provided by these respective offices. Patient advised to contact urology clinic if any urologic-pertaining questions, concerns, new symptoms or problems arise in the interim period.  There are no Patient Instructions on file for this visit.  Electronically signed by:  Lauraine JAYSON Oz, FNP   04/18/24    2:38 PM

## 2024-04-24 DIAGNOSIS — R140 Abdominal distension (gaseous): Secondary | ICD-10-CM | POA: Diagnosis not present

## 2024-04-24 DIAGNOSIS — E1165 Type 2 diabetes mellitus with hyperglycemia: Secondary | ICD-10-CM | POA: Diagnosis not present

## 2024-04-24 DIAGNOSIS — R112 Nausea with vomiting, unspecified: Secondary | ICD-10-CM | POA: Diagnosis not present

## 2024-04-24 DIAGNOSIS — Z299 Encounter for prophylactic measures, unspecified: Secondary | ICD-10-CM | POA: Diagnosis not present

## 2024-04-24 DIAGNOSIS — R197 Diarrhea, unspecified: Secondary | ICD-10-CM | POA: Diagnosis not present

## 2024-04-26 ENCOUNTER — Encounter (INDEPENDENT_AMBULATORY_CARE_PROVIDER_SITE_OTHER): Payer: Self-pay | Admitting: *Deleted

## 2024-04-27 ENCOUNTER — Encounter (INDEPENDENT_AMBULATORY_CARE_PROVIDER_SITE_OTHER): Payer: Self-pay | Admitting: *Deleted

## 2024-05-08 ENCOUNTER — Encounter (INDEPENDENT_AMBULATORY_CARE_PROVIDER_SITE_OTHER): Payer: Self-pay | Admitting: Gastroenterology

## 2024-05-08 ENCOUNTER — Other Ambulatory Visit: Payer: Self-pay | Admitting: *Deleted

## 2024-05-08 ENCOUNTER — Ambulatory Visit (INDEPENDENT_AMBULATORY_CARE_PROVIDER_SITE_OTHER): Admitting: Gastroenterology

## 2024-05-08 ENCOUNTER — Encounter: Payer: Self-pay | Admitting: *Deleted

## 2024-05-08 ENCOUNTER — Telehealth: Payer: Self-pay | Admitting: *Deleted

## 2024-05-08 VITALS — BP 99/62 | HR 106 | Temp 97.8°F | Ht 62.0 in | Wt 124.0 lb

## 2024-05-08 DIAGNOSIS — Z860100 Personal history of colon polyps, unspecified: Secondary | ICD-10-CM | POA: Insufficient documentation

## 2024-05-08 DIAGNOSIS — F17210 Nicotine dependence, cigarettes, uncomplicated: Secondary | ICD-10-CM

## 2024-05-08 DIAGNOSIS — R194 Change in bowel habit: Secondary | ICD-10-CM

## 2024-05-08 DIAGNOSIS — R1013 Epigastric pain: Secondary | ICD-10-CM | POA: Diagnosis not present

## 2024-05-08 DIAGNOSIS — R634 Abnormal weight loss: Secondary | ICD-10-CM | POA: Diagnosis not present

## 2024-05-08 DIAGNOSIS — K5904 Chronic idiopathic constipation: Secondary | ICD-10-CM | POA: Insufficient documentation

## 2024-05-08 DIAGNOSIS — R140 Abdominal distension (gaseous): Secondary | ICD-10-CM | POA: Diagnosis not present

## 2024-05-08 MED ORDER — PSYLLIUM 58.6 % PO PACK: 1.0000 | PACK | Freq: Two times a day (BID) | ORAL | 2 refills | Status: AC

## 2024-05-08 MED ORDER — PEG 3350-KCL-NA BICARB-NACL 420 G PO SOLR: 4000.0000 mL | Freq: Once | ORAL | 0 refills | Status: AC

## 2024-05-08 NOTE — Progress Notes (Signed)
 Margel Joens Faizan Zeya Balles , M.D. Gastroenterology & Hepatology [facility] [address] [place], [place] [address] Primary Care Physician: Rosan Jacquline NOVAK, NP [address] [place] [address]  Chief Complaint: Abdominal bloating, diarrhea  History of Present Illness: Laura Suarez is a 58 y.o. female with hypertension, diabetes, depression who presents for evaluation of abdominal bloating, altered bowel movements and weight loss  Patient reports that for past few months she has been having explosive diarrhea.  2-3 bowel movements daily Bristol stool scale 6-7.  Patient previously had history of constipation where she was on Linzess at 1 time which she had to stop previously has a history of poor prep colonoscopy. Patient has not noticed any association with certain type of food with bloating or diarrhea The patient denies having any fever, chills, hematochezia, melena, hematemesis, abdominal pain, jaundice, pruritus  Last blood work from 09/2023 potassium 4.9 hemoglobin 14.3 Last EGD:12/2022         - Gastritis. Biopsied.                         - Normal esophagus.                         - Normal first portion of the duodenum and second                         portion of the duodenum.   Last Colonoscopy:05/2023  - Diverticulosis in the left colon. - A single non- bleeding colonic angioectasia. No intervention performed - Nodule at the hepatic flexure. Biopsied. Likely diverticulosis on an edematous fold . NBMI performed with similar mucosa as the colon - Non- bleeding external internal hemorrhoids.  A. COLON, HEPATIC FLEXURE NODULE, BIOPSY:  -  Colonic mucosa with mild superficial hyperplastic change, multiple  levels examined.  Social: Ex-smoker Surgical: Hysterectomy  Past Medical History: Past Medical History:  Diagnosis Date   Bipolar disorder (HCC)    Depression    Diabetes mellitus without complication (HCC)    High  cholesterol     Past Surgical History: Past Surgical History:  Procedure Laterality Date   ABDOMINAL HYSTERECTOMY     BIOPSY  05/16/2023   Procedure: BIOPSY;  Surgeon: Cinderella Deatrice FALCON, MD;  [facility];  Service: Endoscopy;;   COLONOSCOPY WITH PROPOFOL  N/A 05/16/2023   Procedure: COLONOSCOPY WITH PROPOFOL ;  Surgeon: Cinderella Deatrice FALCON, MD;  [facility];  Service: Endoscopy;  Laterality: N/A;  7:30AM;ASA 2   INTERSTIM IMPLANT PLACEMENT N/A 08/01/2023   Procedure: RENNA IMPLANT FIRST STAGE;  Surgeon: Sherrilee Belvie CROME, MD;  Location: [facility];  Service: Urology;  Laterality: N/A;   INTERSTIM IMPLANT PLACEMENT N/A 09/13/2023   Procedure: RENNA IMPLANT SECOND STAGE;  Surgeon: Sherrilee Belvie CROME, MD;  Location: [facility];  Service: Urology;  Laterality: N/A;    Family History:History reviewed. No pertinent family history.  Social History: Social History   Tobacco Use  Smoking Status Every Day   Types: Cigars  Smokeless Tobacco Never  Tobacco Comments   3 cigars per day    Social History   Substance and Sexual Activity  Alcohol Use None   Social History   Substance and Sexual Activity  Drug Use Never    Allergies: Allergies  Allergen Reactions   Sulfa Antibiotics Rash   Ziprasidone Hcl Other (See Comments)    Unable to move  limbs      Medications: Current Outpatient Medications  Medication Sig Dispense Refill   aspirin 81 MG EC tablet Take 81 mg by mouth daily.     atorvastatin (LIPITOR) 40 MG tablet Take 40 mg by mouth at bedtime.     Biotin 5000 MCG CAPS Take 5,000 mcg by mouth daily.     busPIRone (BUSPAR) 10 MG tablet Take 10 mg by mouth 3 (three) times daily.     Coenzyme Q10 100 MG CHEW Chew 100 mg by mouth daily.     FARXIGA 5 MG TABS tablet Take 5 mg by mouth every morning.     ferrous sulfate 324 MG TBEC Take 324 mg by mouth 4 (four) times a week. (Patient taking differently: Take 65 mg by mouth 4 (four) times a week.)      lisinopril (ZESTRIL) 2.5 MG tablet Take 2.5 mg by mouth every morning.     metFORMIN (GLUCOPHAGE) 1000 MG tablet Take 1,000 mg by mouth 2 (two) times daily.     Multiple Vitamin (MULTIVITAMIN WITH MINERALS) TABS tablet Take 1 tablet by mouth daily.     omeprazole (PRILOSEC) 40 MG capsule Take 40 mg by mouth at bedtime.     OXcarbazepine (TRILEPTAL) 150 MG tablet Take 300 mg by mouth at bedtime.     PARoxetine (PAXIL) 40 MG tablet Take 40 mg by mouth daily.     Probiotic Product (PROBIOTIC PO) Take 1 tablet by mouth 3 (three) times a week.     psyllium (METAMUCIL) 58.6 % packet Take 1 packet by mouth 2 (two) times daily. 60 packet 2   risperiDONE (RISPERDAL) 1 MG tablet Take 1-2 mg by mouth See admin instructions. Take 1 mg by mouth in the morning and 2 mg in the evening     traZODone (DESYREL) 150 MG tablet Take 150 mg by mouth at bedtime.     TRESIBA FLEXTOUCH 200 UNIT/ML FlexTouch Pen Inject 26 Units into the skin at bedtime.     Apoaequorin (PREVAGEN PO) Take 1 capsule by mouth daily. (Patient not taking: Reported on 05/08/2024)     HYDROcodone -acetaminophen  (NORCO) 5-325 MG tablet Take 1 tablet by mouth every 6 (six) hours as needed for moderate pain (pain score 4-6). (Patient not taking: Reported on 05/08/2024) 30 tablet 0   hydrOXYzine (ATARAX) 25 MG tablet Take 25 mg by mouth daily as needed for anxiety. (Patient not taking: Reported on 05/08/2024)     Potassium 99 MG TABS Take 99 mg by mouth in the morning and at bedtime. (Patient not taking: Reported on 05/08/2024)     No current facility-administered medications for this visit.    Review of Systems: GENERAL: negative for malaise, night sweats HEENT: No changes in hearing or vision, no nose bleeds or other nasal problems. NECK: Negative for lumps, goiter, pain and significant neck swelling RESPIRATORY: Negative for cough, wheezing CARDIOVASCULAR: Negative for chest pain, leg swelling, palpitations, orthopnea GI: SEE  HPI MUSCULOSKELETAL: Negative for joint pain or swelling, back pain, and muscle pain. SKIN: Negative for lesions, rash HEMATOLOGY Negative for prolonged bleeding, bruising easily, and swollen nodes. ENDOCRINE: Negative for cold or heat intolerance, polyuria, polydipsia and goiter. NEURO: negative for tremor, gait imbalance, syncope and seizures. The remainder of the review of systems is noncontributory.   Physical Exam: BP 99/62   Pulse (!) 106   Temp 97.8 F (36.6 C)   Ht 5' 2 (1.575 m)   Wt 124 lb (56.2 kg)   BMI 22.68 kg/m  GENERAL: The patient is AO x3, in no acute distress. HEENT: Head is normocephalic and atraumatic. EOMI are intact. Mouth is well hydrated and without lesions. NECK: Supple. No masses LUNGS: Clear to auscultation. No presence of rhonchi/wheezing/rales. Adequate chest expansion HEART: RRR, normal s1 and s2. ABDOMEN: Soft, nontender, no guarding, no peritoneal signs, and nondistended. BS +. No masses.  Imaging/Labs: as above     Latest Ref Rng & Units 09/13/2023   11:01 AM 08/25/2023   11:51 AM 08/01/2023    8:49 AM  CBC  Hemoglobin 12.0 - 15.0 g/dL 85.6  85.6  86.3   Hematocrit 36.0 - 46.0 % 42.0  42.0  40.0    No results found for: IRON, TIBC, FERRITIN  I personally reviewed and interpreted the available labs, imaging and endoscopic files.  Impression and Plan:  Laura Suarez is a 58 y.o. female with hypertension, diabetes, depression who presents for evaluation of abdominal bloating, altered bowel movements and weight loss  #Altered bowel movements # Dyspepsia/bloating # History of colon polyp  Patient previously had a history of constipation was on Linzess at one time , had history of poor prep colonoscopy in 1 time  Recently having more diarrhea and bloating.  This could be SIBO, food intolerance or microscopic colitis.  Patient is on SSRI As patient has history of constipation  Patient had upper endoscopy last year with negative  H. pylori testing Will obtain abdominal x-ray to assess stool burden, if large amount is found we will give bowel purge  Metamucil twice daily to bulk up stool Continue PPI daily, Gas-X and Beano  Given new symptoms of dyspepsia and bloating with history of smoking will obtain baseline CT abdomen especially with unintentional weight loss  Colonoscopy 05/2023 with hepatic flexure nodule which appeared to be diverticulum on a thick fold hence removal was not performed as no distinct polyp was demarcated after NBMI imaging,although  biopsies positive for hyperplastic changes .   Given these findings patient was advised to have repeat colonoscopy in 1 year to assess hepatic flexur this could be overflow diarrhea e again to ensure stability of the findings and no growth .   Will proceed with colonoscopy for above and also obtain random colon biopsies to rule out microscopic colitis  If symptom persist may proceed with SIBO testing in future  Low FODMAP diet   All questions were answered.      Laura Maziarz Faizan Nichele Slawson, MD Gastroenterology and Hepatology [facility]   This chart has been completed using [facility] Dictation software, and while attempts have been made to ensure accuracy , certain words and phrases may not be transcribed as intended

## 2024-05-08 NOTE — H&P (View-Only) (Signed)
 Laura Suarez Laura Suarez Laura Suarez , M.D. Gastroenterology & Hepatology [facility] [address] [place], [place] [address] Primary Care Physician: Laura Jacquline NOVAK, NP [address] [place] [address]  Chief Complaint: Abdominal bloating, diarrhea  History of Present Illness: Laura Suarez is a 58 y.o. female with hypertension, diabetes, depression who presents for evaluation of abdominal bloating, altered bowel movements and weight loss  Patient reports that for past few months she has been having explosive diarrhea.  2-3 bowel movements daily Bristol stool scale 6-7.  Patient previously had history of constipation where she was on Linzess at 1 time which she had to stop previously has a history of poor prep colonoscopy. Patient has not noticed any association with certain type of food with bloating or diarrhea The patient denies having any fever, chills, hematochezia, melena, hematemesis, abdominal pain, jaundice, pruritus  Last blood work from 09/2023 potassium 4.9 hemoglobin 14.3 Last EGD:12/2022         - Gastritis. Biopsied.                         - Normal esophagus.                         - Normal first portion of the duodenum and second                         portion of the duodenum.   Last Colonoscopy:05/2023  - Diverticulosis in the left colon. - A single non- bleeding colonic angioectasia. No intervention performed - Nodule at the hepatic flexure. Biopsied. Likely diverticulosis on an edematous fold . NBMI performed with similar mucosa as the colon - Non- bleeding external internal hemorrhoids.  A. COLON, HEPATIC FLEXURE NODULE, BIOPSY:  -  Colonic mucosa with mild superficial hyperplastic change, multiple  levels examined.  Social: Ex-smoker Surgical: Hysterectomy  Past Medical History: Past Medical History:  Diagnosis Date   Bipolar disorder (HCC)    Depression    Diabetes mellitus without complication (HCC)    High  cholesterol     Past Surgical History: Past Surgical History:  Procedure Laterality Date   ABDOMINAL HYSTERECTOMY     BIOPSY  05/16/2023   Procedure: BIOPSY;  Surgeon: Laura Deatrice FALCON, MD;  [facility];  Service: Endoscopy;;   COLONOSCOPY WITH PROPOFOL  N/A 05/16/2023   Procedure: COLONOSCOPY WITH PROPOFOL ;  Surgeon: Laura Deatrice FALCON, MD;  Location: [facility];  Service: Endoscopy;  Laterality: N/A;  7:30AM;ASA 2   INTERSTIM IMPLANT PLACEMENT N/A 08/01/2023   Procedure: RENNA IMPLANT FIRST STAGE;  Surgeon: Laura Belvie CROME, MD;  Location: [facility];  Service: Urology;  Laterality: N/A;   INTERSTIM IMPLANT PLACEMENT N/A 09/13/2023   Procedure: RENNA IMPLANT SECOND STAGE;  Surgeon: Laura Belvie CROME, MD;  Location: [facility];  Service: Urology;  Laterality: N/A;    Family History:History reviewed. No pertinent family history.  Social History: Social History   Tobacco Use  Smoking Status Every Day   Types: Cigars  Smokeless Tobacco Never  Tobacco Comments   3 cigars per day    Social History   Substance and Sexual Activity  Alcohol Use None   Social History   Substance and Sexual Activity  Drug Use Never    Allergies: Allergies  Allergen Reactions   Sulfa Antibiotics Rash   Ziprasidone Hcl Other (See Comments)    Unable to move  limbs      Medications: Current Outpatient Medications  Medication Sig Dispense Refill   aspirin 81 MG EC tablet Take 81 mg by mouth daily.     atorvastatin (LIPITOR) 40 MG tablet Take 40 mg by mouth at bedtime.     Biotin 5000 MCG CAPS Take 5,000 mcg by mouth daily.     busPIRone (BUSPAR) 10 MG tablet Take 10 mg by mouth 3 (three) times daily.     Coenzyme Q10 100 MG CHEW Chew 100 mg by mouth daily.     FARXIGA 5 MG TABS tablet Take 5 mg by mouth every morning.     ferrous sulfate 324 MG TBEC Take 324 mg by mouth 4 (four) times a week. (Patient taking differently: Take 65 mg by mouth 4 (four) times a week.)      lisinopril (ZESTRIL) 2.5 MG tablet Take 2.5 mg by mouth every morning.     metFORMIN (GLUCOPHAGE) 1000 MG tablet Take 1,000 mg by mouth 2 (two) times daily.     Multiple Vitamin (MULTIVITAMIN WITH MINERALS) TABS tablet Take 1 tablet by mouth daily.     omeprazole (PRILOSEC) 40 MG capsule Take 40 mg by mouth at bedtime.     OXcarbazepine (TRILEPTAL) 150 MG tablet Take 300 mg by mouth at bedtime.     PARoxetine (PAXIL) 40 MG tablet Take 40 mg by mouth daily.     Probiotic Product (PROBIOTIC PO) Take 1 tablet by mouth 3 (three) times a week.     psyllium (METAMUCIL) 58.6 % packet Take 1 packet by mouth 2 (two) times daily. 60 packet 2   risperiDONE (RISPERDAL) 1 MG tablet Take 1-2 mg by mouth See admin instructions. Take 1 mg by mouth in the morning and 2 mg in the evening     traZODone (DESYREL) 150 MG tablet Take 150 mg by mouth at bedtime.     TRESIBA FLEXTOUCH 200 UNIT/ML FlexTouch Pen Inject 26 Units into the skin at bedtime.     Apoaequorin (PREVAGEN PO) Take 1 capsule by mouth daily. (Patient not taking: Reported on 05/08/2024)     HYDROcodone -acetaminophen  (NORCO) 5-325 MG tablet Take 1 tablet by mouth every 6 (six) hours as needed for moderate pain (pain score 4-6). (Patient not taking: Reported on 05/08/2024) 30 tablet 0   hydrOXYzine (ATARAX) 25 MG tablet Take 25 mg by mouth daily as needed for anxiety. (Patient not taking: Reported on 05/08/2024)     Potassium 99 MG TABS Take 99 mg by mouth in the morning and at bedtime. (Patient not taking: Reported on 05/08/2024)     No current facility-administered medications for this visit.    Review of Systems: GENERAL: negative for malaise, night sweats HEENT: No changes in hearing or vision, no nose bleeds or other nasal problems. NECK: Negative for lumps, goiter, pain and significant neck swelling RESPIRATORY: Negative for cough, wheezing CARDIOVASCULAR: Negative for chest pain, leg swelling, palpitations, orthopnea GI: SEE  HPI MUSCULOSKELETAL: Negative for joint pain or swelling, back pain, and muscle pain. SKIN: Negative for lesions, rash HEMATOLOGY Negative for prolonged bleeding, bruising easily, and swollen nodes. ENDOCRINE: Negative for cold or heat intolerance, polyuria, polydipsia and goiter. NEURO: negative for tremor, gait imbalance, syncope and seizures. The remainder of the review of systems is noncontributory.   Physical Exam: BP 99/62   Pulse (!) 106   Temp 97.8 F (36.6 C)   Ht 5' 2 (1.575 m)   Wt 124 lb (56.2 kg)   BMI 22.68 kg/m  GENERAL: The patient is AO x3, in no acute distress. HEENT: Head is normocephalic and atraumatic. EOMI are intact. Mouth is well hydrated and without lesions. NECK: Supple. No masses LUNGS: Clear to auscultation. No presence of rhonchi/wheezing/rales. Adequate chest expansion HEART: RRR, normal s1 and s2. ABDOMEN: Soft, nontender, no guarding, no peritoneal signs, and nondistended. BS +. No masses.  Imaging/Labs: as above     Latest Ref Rng & Units 09/13/2023   11:01 AM 08/25/2023   11:51 AM 08/01/2023    8:49 AM  CBC  Hemoglobin 12.0 - 15.0 g/dL 85.6  85.6  86.3   Hematocrit 36.0 - 46.0 % 42.0  42.0  40.0    No results found for: IRON, TIBC, FERRITIN  I personally reviewed and interpreted the available labs, imaging and endoscopic files.  Impression and Plan:  Laura Suarez is a 58 y.o. female with hypertension, diabetes, depression who presents for evaluation of abdominal bloating, altered bowel movements and weight loss  #Altered bowel movements # Dyspepsia/bloating # History of colon polyp  Patient previously had a history of constipation was on Linzess at one time , had history of poor prep colonoscopy in 1 time  Recently having more diarrhea and bloating.  This could be SIBO, food intolerance or microscopic colitis.  Patient is on SSRI As patient has history of constipation  Patient had upper endoscopy last year with negative  H. pylori testing Will obtain abdominal x-ray to assess stool burden, if large amount is found we will give bowel purge  Metamucil twice daily to bulk up stool Continue PPI daily, Gas-X and Beano  Given new symptoms of dyspepsia and bloating with history of smoking will obtain baseline CT abdomen especially with unintentional weight loss  Colonoscopy 05/2023 with hepatic flexure nodule which appeared to be diverticulum on a thick fold hence removal was not performed as no distinct polyp was demarcated after NBMI imaging,although  biopsies positive for hyperplastic changes .   Given these findings patient was advised to have repeat colonoscopy in 1 year to assess hepatic flexur this could be overflow diarrhea e again to ensure stability of the findings and no growth .   Will proceed with colonoscopy for above and also obtain random colon biopsies to rule out microscopic colitis  If symptom persist may proceed with SIBO testing in future  Low FODMAP diet   All questions were answered.      Laura Suarez Laura Suarez Laura Slawson, MD Gastroenterology and Hepatology [facility]   This chart has been completed using [facility] Dictation software, and while attempts have been made to ensure accuracy , certain words and phrases may not be transcribed as intended

## 2024-05-08 NOTE — Patient Instructions (Signed)
 It was very nice to meet you today, as dicussed with will plan for the following :  1)abdomen xray  2)Ensure adequate fluid intake: Aim for 8 glasses of water  daily. Follow a high fiber diet: Include foods such as dates, prunes, pears, and kiwi. Use Metamucil twice a day.  3) Low FODMAP diet  4) Gas-X and Beano daily  5) CT abdomen and pelvis  6) Colonoscopy

## 2024-05-08 NOTE — Telephone Encounter (Signed)
 [employer] PA for CT: CPT Code 25822 Description: CT ABDOMEN & PELVIS W/ Case Number: [id number] Review Date: 05/08/2024 12:20:42 PM Expiration Date: N/A Status: This member's benefit plan did not require a prior authorization for this request.

## 2024-05-08 NOTE — Telephone Encounter (Signed)
 [employer] PA for colonoscopy: Notification or Prior Authorization is not required for the requested services You are not required to submit a notification/prior authorization based on the information provided. If you have general questions about the prior authorization requirements, visit [email or web address] > Clinician Resources > Advance and Admission Notification Requirements. The number above acknowledges your notification. Please write this reference number down for future reference. If you would like to request an organization determination, please call us  at [phone]. Decision ID #: [id number]

## 2024-05-17 ENCOUNTER — Ambulatory Visit (HOSPITAL_COMMUNITY)
Admission: RE | Admit: 2024-05-17 | Discharge: 2024-05-17 | Disposition: A | Discharge status: Home / Self Care | Source: Ambulatory Visit | Attending: Gastroenterology | Admitting: Gastroenterology

## 2024-05-17 DIAGNOSIS — R109 Unspecified abdominal pain: Secondary | ICD-10-CM | POA: Diagnosis not present

## 2024-05-17 DIAGNOSIS — K5730 Diverticulosis of large intestine without perforation or abscess without bleeding: Secondary | ICD-10-CM | POA: Diagnosis not present

## 2024-05-17 DIAGNOSIS — R634 Abnormal weight loss: Secondary | ICD-10-CM | POA: Insufficient documentation

## 2024-05-17 DIAGNOSIS — K5904 Chronic idiopathic constipation: Secondary | ICD-10-CM | POA: Diagnosis not present

## 2024-05-17 DIAGNOSIS — K5900 Constipation, unspecified: Secondary | ICD-10-CM | POA: Diagnosis not present

## 2024-05-17 DIAGNOSIS — R1013 Epigastric pain: Secondary | ICD-10-CM | POA: Diagnosis not present

## 2024-05-17 DIAGNOSIS — R140 Abdominal distension (gaseous): Secondary | ICD-10-CM | POA: Diagnosis not present

## 2024-05-17 LAB — POCT I-STAT CREATININE: Creatinine, Ser: 0.8 mg/dL (ref 0.44–1.00)

## 2024-05-17 MED ORDER — IOHEXOL 300 MG/ML  SOLN
100.0000 mL | Freq: Once | INTRAMUSCULAR | Status: AC | PRN
  Administered 2024-05-17: 100 mL via INTRAVENOUS

## 2024-05-22 ENCOUNTER — Other Ambulatory Visit: Payer: Self-pay

## 2024-05-22 ENCOUNTER — Encounter (HOSPITAL_COMMUNITY)
Admission: RE | Admit: 2024-05-22 | Discharge: 2024-05-22 | Disposition: A | Discharge status: Home / Self Care | Source: Ambulatory Visit | Attending: Gastroenterology | Admitting: Gastroenterology

## 2024-05-22 ENCOUNTER — Ambulatory Visit (INDEPENDENT_AMBULATORY_CARE_PROVIDER_SITE_OTHER): Payer: Self-pay | Admitting: Gastroenterology

## 2024-05-22 ENCOUNTER — Encounter (HOSPITAL_COMMUNITY): Payer: Self-pay

## 2024-05-22 NOTE — Progress Notes (Signed)
 Hi Tanya ,  Can you please call the patient and tell the Ct Scan did not show anything concerning to explain weight loss . Pancreas , Spleen and liver appears normal . Good news   Thanks,  Hassel Uphoff Faizan Dovie Kapusta, MD Gastroenterology and Hepatology [facility]  ==============  CT done for unintentional weight loss  IMPRESSION: No acute findings.   Mild colonic diverticulosis, but without signs of diverticulitis. 05/20/2024 17:16

## 2024-05-22 NOTE — Pre-Procedure Instructions (Signed)
 Pre-op phone call done. Patient states she does not have my chart and did not receive her prep instructions. I gave her th option to come by here or go by the office to get copy of instructions. She opted to go by office. I also instructed her that she need to stop solid food by 2 pm today. She verbalized understanding of this.

## 2024-05-24 ENCOUNTER — Encounter (HOSPITAL_COMMUNITY)
Admission: RE | Disposition: A | Discharge status: Home / Self Care | Payer: Self-pay | Source: Home / Self Care | Attending: Gastroenterology

## 2024-05-24 ENCOUNTER — Encounter (HOSPITAL_COMMUNITY): Payer: Self-pay | Admitting: Gastroenterology

## 2024-05-24 ENCOUNTER — Encounter (INDEPENDENT_AMBULATORY_CARE_PROVIDER_SITE_OTHER): Payer: Self-pay | Admitting: *Deleted

## 2024-05-24 ENCOUNTER — Ambulatory Visit (HOSPITAL_BASED_OUTPATIENT_CLINIC_OR_DEPARTMENT_OTHER): Admitting: Anesthesiology

## 2024-05-24 ENCOUNTER — Ambulatory Visit (HOSPITAL_COMMUNITY): Admitting: Anesthesiology

## 2024-05-24 ENCOUNTER — Ambulatory Visit (HOSPITAL_COMMUNITY)
Admission: RE | Admit: 2024-05-24 | Discharge: 2024-05-24 | Disposition: A | Discharge status: Home / Self Care | Attending: Gastroenterology | Admitting: Gastroenterology

## 2024-05-24 ENCOUNTER — Other Ambulatory Visit: Payer: Self-pay

## 2024-05-24 DIAGNOSIS — K635 Polyp of colon: Secondary | ICD-10-CM | POA: Diagnosis not present

## 2024-05-24 DIAGNOSIS — K5520 Angiodysplasia of colon without hemorrhage: Secondary | ICD-10-CM | POA: Insufficient documentation

## 2024-05-24 DIAGNOSIS — Z7984 Long term (current) use of oral hypoglycemic drugs: Secondary | ICD-10-CM | POA: Insufficient documentation

## 2024-05-24 DIAGNOSIS — Z1211 Encounter for screening for malignant neoplasm of colon: Secondary | ICD-10-CM

## 2024-05-24 DIAGNOSIS — Z09 Encounter for follow-up examination after completed treatment for conditions other than malignant neoplasm: Secondary | ICD-10-CM | POA: Insufficient documentation

## 2024-05-24 DIAGNOSIS — K529 Noninfective gastroenteritis and colitis, unspecified: Secondary | ICD-10-CM | POA: Diagnosis not present

## 2024-05-24 DIAGNOSIS — Z794 Long term (current) use of insulin: Secondary | ICD-10-CM | POA: Diagnosis not present

## 2024-05-24 DIAGNOSIS — K219 Gastro-esophageal reflux disease without esophagitis: Secondary | ICD-10-CM | POA: Diagnosis not present

## 2024-05-24 DIAGNOSIS — K909 Intestinal malabsorption, unspecified: Secondary | ICD-10-CM

## 2024-05-24 DIAGNOSIS — F17290 Nicotine dependence, other tobacco product, uncomplicated: Secondary | ICD-10-CM | POA: Insufficient documentation

## 2024-05-24 DIAGNOSIS — Q438 Other specified congenital malformations of intestine: Secondary | ICD-10-CM

## 2024-05-24 DIAGNOSIS — D120 Benign neoplasm of cecum: Secondary | ICD-10-CM | POA: Diagnosis not present

## 2024-05-24 DIAGNOSIS — K648 Other hemorrhoids: Secondary | ICD-10-CM | POA: Insufficient documentation

## 2024-05-24 DIAGNOSIS — E7800 Pure hypercholesterolemia, unspecified: Secondary | ICD-10-CM | POA: Insufficient documentation

## 2024-05-24 DIAGNOSIS — D122 Benign neoplasm of ascending colon: Secondary | ICD-10-CM | POA: Insufficient documentation

## 2024-05-24 DIAGNOSIS — E119 Type 2 diabetes mellitus without complications: Secondary | ICD-10-CM

## 2024-05-24 DIAGNOSIS — Z860100 Personal history of colon polyps, unspecified: Secondary | ICD-10-CM

## 2024-05-24 DIAGNOSIS — K6389 Other specified diseases of intestine: Secondary | ICD-10-CM | POA: Diagnosis not present

## 2024-05-24 DIAGNOSIS — K5140 Inflammatory polyps of colon without complications: Secondary | ICD-10-CM | POA: Diagnosis not present

## 2024-05-24 HISTORY — PX: COLONOSCOPY: SHX5424

## 2024-05-24 LAB — GLUCOSE, CAPILLARY: Glucose-Capillary: 153 mg/dL — ABNORMAL HIGH (ref 70–99)

## 2024-05-24 LAB — HM COLONOSCOPY

## 2024-05-24 SURGERY — COLONOSCOPY
Anesthesia: General

## 2024-05-24 MED ORDER — SPOT INK MARKER SYRINGE KIT
PACK | SUBMUCOSAL | Status: DC | PRN
  Administered 2024-05-24: 2 mL via SUBMUCOSAL

## 2024-05-24 MED ORDER — PROPOFOL 10 MG/ML IV BOLUS
INTRAVENOUS | Status: DC | PRN
  Administered 2024-05-24 (×2): 50 mg via INTRAVENOUS

## 2024-05-24 MED ORDER — LACTATED RINGERS IV SOLN: INTRAVENOUS | Status: DC

## 2024-05-24 MED ORDER — PROPOFOL 500 MG/50ML IV EMUL
INTRAVENOUS | Status: DC | PRN
  Administered 2024-05-24: 125 ug/kg/min via INTRAVENOUS

## 2024-05-24 MED ORDER — ONDANSETRON HCL 4 MG/2ML IJ SOLN
INTRAMUSCULAR | Status: DC | PRN
  Administered 2024-05-24: 4 mg via INTRAVENOUS

## 2024-05-24 MED ORDER — LIDOCAINE 2% (20 MG/ML) 5 ML SYRINGE
INTRAMUSCULAR | Status: DC | PRN
Start: 2024-05-24 — End: 2024-05-24
  Administered 2024-05-24: 80 mg via INTRAVENOUS

## 2024-05-24 MED ORDER — LACTATED RINGERS IV SOLN: INTRAVENOUS | Status: DC | PRN

## 2024-05-24 NOTE — Transfer of Care (Signed)
 Immediate Anesthesia Transfer of Care Note  Patient: Laura Suarez  Procedure(s) Performed: COLONOSCOPY  Patient Location: PACU  Anesthesia Type:MAC  Level of Consciousness: awake and alert   Airway & Oxygen Therapy: Patient Spontanous Breathing and Patient connected to nasal cannula oxygen  Post-op Assessment: Report given to RN and Post -op Vital signs reviewed and stable  Post vital signs: Reviewed and stable  Last Vitals:  Vitals Value Taken Time  BP    Temp    Pulse    Resp    SpO2      Last Pain:  Vitals:   05/24/24 1133  TempSrc:   PainSc: 0-No pain         Complications: No notable events documented.

## 2024-05-24 NOTE — Discharge Instructions (Addendum)

## 2024-05-24 NOTE — Interval H&P Note (Signed)
 History and Physical Interval Note:  05/24/2024 10:03 AM  Laura Suarez  has presented today for surgery, with the diagnosis of weight loss,diarrhea,history of polyps.  The various methods of treatment have been discussed with the patient and family. After consideration of risks, benefits and other options for treatment, the patient has consented to  Procedure(s) with comments: COLONOSCOPY (N/A) - 11:15 am, asa 1-2 as a surgical intervention.  The patient's history has been reviewed, patient examined, no change in status, stable for surgery.  I have reviewed the patient's chart and labs.  Questions were answered to the patient's satisfaction.     [name]

## 2024-05-24 NOTE — Anesthesia Postprocedure Evaluation (Signed)
 Anesthesia Post Note  Patient: Laura Suarez  Procedure(s) Performed: COLONOSCOPY  Patient location during evaluation: Phase II Anesthesia Type: General Level of consciousness: awake and alert Pain management: pain level controlled Vital Signs Assessment: post-procedure vital signs reviewed and stable Respiratory status: spontaneous breathing, nonlabored ventilation and respiratory function stable Cardiovascular status: blood pressure returned to baseline and stable Postop Assessment: no apparent nausea or vomiting Anesthetic complications: no   There were no known notable events for this encounter.   Last Vitals:  Vitals:   05/24/24 1015 05/24/24 1250  BP: 115/81 128/87  Pulse:    Resp: (!) 31 16  Temp:  36.5 C  SpO2:  95%    Last Pain:  Vitals:   05/24/24 1133  TempSrc:   PainSc: 0-No pain                 Cortland Crehan L Lailynn Southgate

## 2024-05-24 NOTE — Anesthesia Preprocedure Evaluation (Signed)
 Anesthesia Evaluation  Patient identified by MRN, date of birth, ID band Patient awake    Reviewed: Allergy & Precautions, H&P , NPO status , Patient's Chart, lab work & pertinent test results  Airway Mallampati: III  TM Distance: >3 FB Neck ROM: Full    Dental  (+) Dental Advisory Given, [place] upper:   Pulmonary Current Smoker and Patient abstained from smoking. CT - Lungs/Pleura: Central airways are patent mild paraseptal emphysema. No consolidation, pleural effusion or pneumothorax. Stable irregular solid pulmonary nodule of the right lower lobe measuring 7 x 5 mm on series 4, image 80.   Upper Abdomen: Stable left adrenal gland adenoma, no specific follow-up imaging is recommended. No acute abnormality.    Pulmonary exam normal breath sounds clear to auscultation       Cardiovascular Exercise Tolerance: Good Normal cardiovascular exam Rhythm:Regular Rate:Normal     Neuro/Psych  PSYCHIATRIC DISORDERS  Depression Bipolar Disorder   negative neurological ROS     GI/Hepatic Neg liver ROS,GERD  Medicated and Controlled,,  Endo/Other  diabetes, Well Controlled, Type 2, Oral Hypoglycemic Agents, Insulin Dependent    Renal/GU negative Renal ROS  negative genitourinary   Musculoskeletal negative musculoskeletal ROS (+)    Abdominal Normal abdominal exam  (+)   Peds negative pediatric ROS (+)  Hematology negative hematology ROS (+)   Anesthesia Other Findings Left adrenal mass (HCC) Microscopic hematuria Urge incontinence of urine  Potasium is 4.9 today!    Reproductive/Obstetrics negative OB ROS                              Anesthesia Physical Anesthesia Plan  ASA: 3  Anesthesia Plan: General   Post-op Pain Management: Minimal or no pain anticipated   Induction: Intravenous  PONV Risk Score and Plan: Propofol  infusion  Airway Management Planned: Nasal Cannula and  Natural Airway  Additional Equipment: None  Intra-op Plan:   Post-operative Plan:   Informed Consent: I have reviewed the patients History and Physical, chart, labs and discussed the procedure including the risks, benefits and alternatives for the proposed anesthesia with the patient or authorized representative who has indicated his/her understanding and acceptance.     Dental advisory given  Plan Discussed with: CRNA  Anesthesia Plan Comments:         Anesthesia Quick Evaluation

## 2024-05-24 NOTE — Op Note (Addendum)
 [facility] Patient Name: Laura Czerwinski Procedure Date: 05/24/2024 11:28 AM MRN: [id number] Date of Birth: 1966-04-15 Attending MD: Deatrice Dine , MD, [id number] CSN: [id number] Age: 58 Admit Type: Outpatient Procedure:                Colonoscopy Indications:              High risk colon cancer surveillance: Personal                            history of colonic polyps Providers:                Deatrice Dine, MD, Devere Lodge, Dorcas Lenis,                            Technician Referring MD:              Medicines:                Monitored Anesthesia Care Complications:            No immediate complications. Estimated Blood Loss:     Estimated blood loss: none. Estimated blood loss                            was minimal. Procedure:                Pre-Anesthesia Assessment:                           - Prior to the procedure, a History and Physical                            was performed, and patient medications and                            allergies were reviewed. The patient's tolerance of                            previous anesthesia was also reviewed. The risks                            and benefits of the procedure and the sedation                            options and risks were discussed with the patient.                            All questions were answered, and informed consent                            was obtained. Prior Anticoagulants: The patient has                            taken no anticoagulant or antiplatelet agents. ASA                            Grade Assessment: II -  A patient with mild systemic                            disease. After reviewing the risks and benefits,                            the patient was deemed in satisfactory condition to                            undergo the procedure.                           After obtaining informed consent, the colonoscope                            was passed under direct vision. Throughout the                             procedure, the patient's blood pressure, pulse, and                            oxygen saturations were monitored continuously. The                            [id number]-[id number] ([id number]) scope was introduced through                            the anus and advanced to the the cecum, identified                            by appendiceal orifice and ileocecal valve. The                            colonoscopy was somewhat difficult due to                            restricted mobility of the colon and a tortuous                            colon. Successful completion of the procedure was                            aided by applying abdominal pressure. The patient                            tolerated the procedure well. The quality of the                            bowel preparation was evaluated using the BBPS                            [facility] Bowel Preparation Scale) with scores of:  Right Colon = 3, Transverse Colon = 3 and Left                            Colon = 3 (entire mucosa seen well with no residual                            staining, small fragments of stool or opaque                            liquid). The total BBPS score equals 9. The                            ileocecal valve, appendiceal orifice, and rectum                            were photographed. Scope In: 11:42:35 AM Scope Out: 12:41:37 PM Scope Withdrawal Time: 0 hours 23 minutes 37 seconds  Total Procedure Duration: 0 hours 59 minutes 2 seconds  Findings:      A 3 mm polyp was found in the ascending colon. The polyp was sessile.       The polyp was removed with a cold snare. Resection and retrieval were       complete.      Two small angioectasias without bleeding were found in the cecum.      A 20 mm polypoid lesion was found in the proximal transverse colon. The       lesion was polypoid, submucosal, ulcerated and umbilicated. No bleeding       was present. This was biopsied  with a cold forceps for histology. Area       was tattooed with an injection of 2 mL of Spot (carbon black).      Non-bleeding internal hemorrhoids were found during retroflexion. The       hemorrhoids were small. Impression:               - One 3 mm polyp in the ascending colon, removed                            with a cold snare. Resected and retrieved.                           - Two non-bleeding colonic angioectasias.                           - Rule out malignancy, umblicated with centrally                            ulceration polypoid ?submucosal lesion in the                            proximal transverse colon. Tattooed and biopsied .                            This lesion was seen 1 year ago during screening  colonoscopy and biopsies at that time suggested                            superficial hyperplastic changes. Today this                            appears to be larger with ulcerated center . NBMI                            with pit pattern similar to surrounding colon                            mucosa. 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                            Recent CT Abdomen was reported to be negative for                            any mass                           -Random colon biopsies obtained to rule out                            microscopic colitis                           - Non-bleeding internal hemorrhoids. Moderate Sedation:      Per Anesthesia Care Recommendation:           - Patient has a contact number available for                            emergencies. The signs and symptoms of potential                            delayed complications were discussed with the                            patient. Return to normal activities tomorrow.                            Written discharge instructions were provided to the                            patient.                           - Resume previous diet.                            - Continue present medications.                           - Await pathology results.                           -  Repeat colonoscopy for surveillance based on                            pathology results.                           - Return to GI clinic as previously scheduled.                           -Depending on results may refer for EUS vs Surgery . Procedure Code(s):        --- Professional ---                           [phone], Colonoscopy, flexible; with removal of                            tumor(s), polyp(s), or other lesion(s) by snare                            technique                           45380, 59, Colonoscopy, flexible; with biopsy,                            single or multiple                           45381, Colonoscopy, flexible; with directed                            submucosal injection(s), any substance Diagnosis Code(s):        --- Professional ---                           Z86.010, Personal history of colonic polyps                           D12.2, Benign neoplasm of ascending colon                           K55.20, Angiodysplasia of colon without hemorrhage                           D49.0, Neoplasm of unspecified behavior of                            digestive system                           K64.8, Other hemorrhoids CPT copyright 2022 American Medical Association. All rights reserved. The codes documented in this report are preliminary and upon coder review may  be revised to meet current compliance requirements. Deatrice Dine, MD Deatrice Dine, MD 05/24/2024 1:00:12 PM This report has been signed electronically. Number of Addenda: 0

## 2024-05-25 ENCOUNTER — Encounter (HOSPITAL_COMMUNITY): Payer: Self-pay | Admitting: Gastroenterology

## 2024-05-25 LAB — SURGICAL PATHOLOGY

## 2024-05-30 ENCOUNTER — Ambulatory Visit (INDEPENDENT_AMBULATORY_CARE_PROVIDER_SITE_OTHER): Payer: Self-pay | Admitting: Gastroenterology

## 2024-05-31 NOTE — Progress Notes (Signed)
 Referral sent, they will contact patient with apt procedure note and pathology result faxed to PCP

## 2024-06-04 ENCOUNTER — Ambulatory Visit: Payer: Self-pay | Admitting: General Surgery

## 2024-06-04 DIAGNOSIS — D490 Neoplasm of unspecified behavior of digestive system: Secondary | ICD-10-CM | POA: Diagnosis not present

## 2024-06-04 NOTE — H&P (Signed)
 REFERRING PHYSICIAN:  Cinderella Deatrice FALCON, MD  PROVIDER:  BERNARDA WANDA NED, MD  MRN: [id number] DOB: [birth year] DATE OF ENCOUNTER: 06/04/2024  Subjective   Chief Complaint:  colon mass    History of Present Illness: Laura Suarez is a 58 y.o. female who is seen today as an office consultation at the request of Dr. Cinderella for evaluation of No chief complaint on file. .  58 year old female who underwent colonoscopy on May 24, 2024 for high risk surveillance due to a personal history of colon polyps.  1 polyp was removed from ascending colon.  There was an umbilicated centrally ulcerated polypoid submucosal lesion noted in the proximal transverse colon.  This was tattooed and biopsied.  Lesion was seen approximately 1 year ago and noted some superficial hyperplastic changes.  It appeared to be larger on recent examination.  This was biopsied again and tattooed.  Biopsies show some noninflammatory hyperplastic changes.  She has a surgical history of hysterectomy and tubal ligation.   Review of Systems: A complete review of systems was obtained from the patient.  I have reviewed this information and discussed as appropriate with the patient.  See HPI as well for other ROS.   Medical History: Past Medical History:  Diagnosis Date   Anxiety    Diabetes mellitus without complication (CMS/HHS-HCC)    GERD (gastroesophageal reflux disease)    Hyperlipidemia     There is no problem list on file for this patient.   Past Surgical History:  Procedure Laterality Date   HYSTERECTOMY       Allergies  Allergen Reactions   Sulfa (Sulfonamide Antibiotics) Rash    Current Outpatient Medications on File Prior to Visit  Medication Sig Dispense Refill   busPIRone (BUSPAR) 10 MG tablet Take 10 mg by mouth 3 (three) times daily     FARXIGA 5 mg tablet Take 5 mg by mouth every morning     HYDROcodone -acetaminophen  (NORCO) 5-325 mg tablet Take 1 tablet by mouth every 6 (six) hours as  needed     lisinopriL (ZESTRIL) 2.5 MG tablet Take 2.5 mg by mouth     psyllium, aspartame, (METAMUCIL) 3.4 gram packet Take 1 packet by mouth 2 (two) times daily     risperiDONE (RISPERDAL) 1 MG tablet Take 1-2 mg by mouth     TRESIBA FLEXTOUCH U-200 pen injector (concentration 200 units/mL) Inject 26 Units subcutaneously     apoaequorin (PREVAGEN) capsule Take 1 capsule by mouth once daily     aspirin 81 MG EC tablet Take 81 mg by mouth once daily     atorvastatin (LIPITOR) 40 MG tablet Take 40 mg by mouth once daily     biotin 5,000 mcg Subl      coenzyme Q10 100 mg Chew Take 100 mg by mouth     ferrous sulfate 324 mg (65 mg iron) EC tablet Take 324 mg by mouth     hydrOXYzine (ATARAX) 25 MG tablet Take 25 mg by mouth once daily as needed for Anxiety     lactobacillus comb no.10 (PROBIOTIC) 20 billion cell Cap      metFORMIN (GLUCOPHAGE) 1000 MG tablet Take 1,000 mg by mouth 2 (two) times daily     multivitamin-lutein (MULTIVITAMIN 50 PLUS) tablet      omeprazole (PRILOSEC) 40 MG DR capsule      OXcarbazepine (TRILEPTAL) 150 MG tablet      PARoxetine (PAXIL) 40 MG tablet      potassium gluconate 2.5 mEq Tab  Take 99 mg by mouth     traZODone (DESYREL) 150 MG tablet      No current facility-administered medications on file prior to visit.    Family History  Problem Relation Age of Onset   Skin cancer Mother    Diabetes Mother    Skin cancer Father    Diabetes Father    Diabetes Sister    Diabetes Brother      Social History   Tobacco Use  Smoking Status Every Day   Types: Cigarettes  Smokeless Tobacco Never     Social History   Socioeconomic History   Marital status: Married  Tobacco Use   Smoking status: Every Day    Types: Cigarettes   Smokeless tobacco: Never  Substance and Sexual Activity   Drug use: Yes    Types: Other-see comments    Comment: marijuana   Social Drivers of Corporate investment banker Strain: Low Risk  (01/06/2022)   Received from [facility]   Overall Financial Resource Strain (CARDIA)    Difficulty of Paying Living Expenses: Not hard at all  Food Insecurity: No Food Insecurity (06/03/2021)   Received from [facility]   Hunger Vital Sign    Within the past 12 months, you worried that your food would run out before you got the money to buy more.: Never true    Within the past 12 months, the food you bought just didn't last and you didn't have money to get more.: Never true  Transportation Needs: No Transportation Needs (01/06/2022)   Received from [facility]   PRAPARE - Transportation    Lack of Transportation (Medical): No    Lack of Transportation (Non-Medical): No  Physical Activity: Insufficiently Active (06/03/2021)   Received from [facility]   Exercise Vital Sign    On average, how many days per week do you engage in moderate to strenuous exercise (like a brisk walk)?: 4 days    On average, how many minutes do you engage in exercise at this level?: 30 min  Stress: No Stress Concern Present (06/03/2021)   Received from [facility] of Occupational Health - Occupational Stress Questionnaire    Feeling of Stress : Not at all  Social Connections: Socially Integrated (06/03/2021)   Received from [facility]   Social Connection and Isolation Panel    In a typical week, how many times do you talk on the phone with family, friends, or neighbors?: Twice a week    How often do you get together with friends or relatives?: Twice a week    How often do you attend church or religious services?: 1 to 4 times per year    Do you belong to any clubs or organizations such as church groups, unions, fraternal or athletic groups, or school groups?: No    How often do you attend meetings of the clubs or organizations you belong to?: 1 to 4 times per year    Are you married, widowed, divorced, separated, never married, or living with a partner?: Married    Objective:    Vitals:   06/04/24  1030 06/04/24 1031  BP: 125/80   Pulse: 109   Temp: 36.9 C (98.5 F)   SpO2: 94%   Weight: 54.9 kg (121 lb)   Height: 157.5 cm (5' 2)   PainSc:  0-No pain     Exam Gen: NAD Abd: soft    Labs, Imaging  and Diagnostic Testing: CT images and report reviewed.  There does appear to be a small mass in the anterior wall of the proximal transverse colon.  Assessment and Plan:  Colonic mass  (primary encounter diagnosis)  58 year old female with an enlarging colon wall mass.  It is felt to be incompletely biopsied endoscopically due to submucosal nature.  We discussed today attempting a endoscopic assisted robotic colon wall resection to remove the mass.  If this cannot be completed safely, we would proceed with a robotic partial colectomy.  We have discussed both these procedures in detail including risk associated with surgery and risk of leak.  All questions were answered. The surgery and anatomy were described to the patient as well as the risks of surgery and the possible complications.  These include: Bleeding, deep abdominal infections and possible wound complications such as hernia and infection, damage to adjacent structures, leak of surgical connections, which can lead to other surgeries and possibly an ostomy, possible need for other procedures, such as abscess drains in radiology, possible prolonged hospital stay, possible diarrhea from removal of part of the colon, possible constipation from narcotics, possible bowel, bladder or sexual dysfunction if having rectal surgery, prolonged fatigue/weakness or appetite loss, possible early recurrence of of disease, possible complications of their medical problems such as heart disease or arrhythmias or lung problems, death (less than 1%). I believe the patient understands and wishes to proceed with the surgery.    Bernarda JAYSON Ned, MD Colon and Rectal Surgery [facility]

## 2024-06-05 ENCOUNTER — Ambulatory Visit: Payer: Self-pay | Admitting: General Surgery

## 2024-07-03 NOTE — Progress Notes (Incomplete)
 COVID Vaccine received:  []  No [x]  Yes Date of any COVID positive Test in last 90 days: no PCP - Jacquline Eastern NP Cardiologist - n/a  Chest x-ray -  EKG -  07/29/23 Epic Stress Test -  ECHO -  Cardiac Cath -   Bowel Prep - [x]  No  []   Yes ______  Pacemaker / ICD device [x]  No []  Yes   Spinal Cord Stimulator:[x]  No []  Yes       Bladder stimulator  History of Sleep Apnea? [x]  No []  Yes   CPAP used?- [x]  No []  Yes    Does the patient monitor blood sugar?          []  No [x]  Yes  []  N/A  Patient has: []  NO Hx DM   []  Pre-DM                 []  DM1  [x]   DM2 Does patient have a [employer] or Dexacom? []  No [x]  Yes   Fasting Blood Sugar Ranges- 80-90 Checks Blood Sugar __8___ times a day  GLP1 agonist / usual dose - no GLP1 instructions:  SGLT-2 inhibitors / usual dose - no SGLT-2 instructions:   Blood Thinner / Instructions:no Aspirin Instructions:81 mg ASA-  Stop 5 days prior to surgery.last dose Friday 07/06/24 8 am  Comments:   Activity level: Patient is able  to climb a flight of stairs without difficulty; [x]  No CP  [x]  No SOB,    Patient can perform ADLs without assistance.   Anesthesia review:   Patient denies shortness of breath, fever, cough and chest pain at PAT appointment.  Patient verbalized understanding and agreement to the Pre-Surgical Instructions that were given to them at this PAT appointment. Patient was also educated of the need to review these PAT instructions again prior to his/her surgery.I reviewed the appropriate phone numbers to call if they have any and questions or concerns.

## 2024-07-03 NOTE — Patient Instructions (Signed)
 SURGICAL WAITING ROOM VISITATION  Patients having surgery or a procedure may have no more than 2 support people in the waiting area - these visitors may rotate.    Children under the age of 10 must have an adult with them who is not the patient.  Visitors with respiratory illnesses are discouraged from visiting and should remain at home.  If the patient needs to stay at the hospital during part of their recovery, the visitor guidelines for inpatient rooms apply. Pre-op nurse will coordinate an appropriate time for 1 support person to accompany patient in pre-op.  This support person may not rotate.    Please refer to the [facility] website for the visitor guidelines for Inpatients (after your surgery is over and you are in a regular room).       Your procedure is scheduled on: 07/12/24   Report to [facility] Main Entrance    Report to admitting at 9:15 AM   Call this number if you have problems the morning of surgery [phone]   Do not eat food :After Midnight.   After Midnight you may have the following liquids until 8:30 AM DAY OF SURGERY  Water  Non-Citrus Juices (without pulp, NO RED-Apple, White grape, White cranberry) Black Coffee (NO MILK/CREAM OR CREAMERS, sugar ok)  Clear Tea (NO MILK/CREAM OR CREAMERS, sugar ok) regular and decaf                             Plain Jell-O (NO RED)                                           Fruit ices (not with fruit pulp, NO RED)                                     Popsicles (NO RED)                                                               Sports drinks like Gatorade (NO RED)              Drink 2 Ensure/G2 drinks AT 10:00 PM the night before surgery.        The day of surgery:  Drink ONE (1) Pre-Surgery or G2 at 8:30 AM the morning of surgery. Drink in one sitting. Do not sip.  This drink was given to you during your hospital  pre-op appointment visit. Nothing else to drink after completing the  Pre-Surgery  G2.           If you have questions, please contact your surgeon's office.   FOLLOW BOWEL PREP AND ANY ADDITIONAL PRE OP INSTRUCTIONS YOU RECEIVED FROM YOUR SURGEON'S OFFICE!!!     Oral Hygiene is also important to reduce your risk of infection.                                    Remember - BRUSH YOUR TEETH THE MORNING OF SURGERY WITH  YOUR REGULAR TOOTHPASTE   Do NOT smoke after Midnight   Stop all vitamins and herbal supplements 7 days before surgery.   Take these medicines the morning of surgery with A SIP OF WATER : Prevagen, Atorvastatin, Buspar, Omeprazole, Paxil, Risperdal.              Do not take Metformin or Lisinopril(Zestril) the morning of surgery.                Hold Farxiga for 72 hours prior to surgery. Last dose to be 07/08/24              You may not have any metal on your body including hair pins, jewelry, and body piercing             Do not wear make-up, lotions, powders, perfumes/cologne, or deodorant  Do not wear nail polish including gel and S&S, artificial/acrylic nails, or any other type of covering on natural nails including finger and toenails. If you have artificial nails, gel coating, etc. that needs to be removed by a nail salon please have this removed prior to surgery or surgery may need to be canceled/ delayed if the surgeon/ anesthesia feels like they are unable to be safely monitored.   Do not shave  48 hours prior to surgery.               Do not bring valuables to the hospital. [facility] IS NOT             RESPONSIBLE   FOR VALUABLES.   Contacts, glasses, dentures or bridgework may not be worn into surgery.   Bring small overnight bag day of surgery.   DO NOT BRING YOUR HOME MEDICATIONS TO THE HOSPITAL. PHARMACY WILL DISPENSE MEDICATIONS LISTED ON YOUR MEDICATION LIST TO YOU DURING YOUR ADMISSION IN THE HOSPITAL!    Patients discharged on the day of surgery will not be allowed to drive home.  Someone NEEDS to stay with you for the first 24 hours  after anesthesia.   Special Instructions: Bring a copy of your healthcare power of attorney and living will documents the day of surgery if you haven't scanned them before.              Please read over the following fact sheets you were given: IF YOU HAVE QUESTIONS ABOUT YOUR PRE-OP INSTRUCTIONS PLEASE CALL [phone] Laura Suarez   If you received a COVID test during your pre-op visit  it is requested that you wear a mask when out in public, stay away from anyone that may not be feeling well and notify your surgeon if you develop symptoms. If you test positive for Covid or have been in contact with anyone that has tested positive in the last 10 days please notify you surgeon.     - Preparing for Surgery Before surgery, you can play an important role.  Because skin is not sterile, your skin needs to be as free of germs as possible.  You can reduce the number of germs on your skin by washing with CHG (chlorahexidine gluconate) soap before surgery.  CHG is an antiseptic cleaner which kills germs and bonds with the skin to continue killing germs even after washing. Please DO NOT use if you have an allergy to CHG or antibacterial soaps.  If your skin becomes reddened/irritated stop using the CHG and inform your nurse when you arrive at Short Stay. Do not shave (including legs and underarms) for at least 48 hours  prior to the first CHG shower.  You may shave your face/neck.  Please follow these instructions carefully:  1.  Shower with CHG Soap the night before surgery and the  morning of surgery.  2.  If you choose to wash your hair, wash your hair first as usual with your normal  shampoo.  3.  After you shampoo, rinse your hair and body thoroughly to remove the shampoo.                             4.  Use CHG as you would any other liquid soap.  You can apply chg directly to the skin and wash.  Gently with a scrungie or clean washcloth.  5.  Apply the CHG Soap to your body ONLY FROM THE NECK DOWN.    Do   not use on face/ open                           Wound or open sores. Avoid contact with eyes, ears mouth and   genitals (private parts).                       Wash face,  Genitals (private parts) with your normal soap.             6.  Wash thoroughly, paying special attention to the area where your    surgery  will be performed.  7.  Thoroughly rinse your body with warm water  from the neck down.  8.  DO NOT shower/wash with your normal soap after using and rinsing off the CHG Soap.                9.  Pat yourself dry with a clean towel.            10.  Wear clean pajamas.            11.  Place clean sheets on your bed the night of your first shower and do not  sleep with pets. Day of Surgery : Do not apply any lotions/deodorants the morning of surgery.  Please wear clean clothes to the hospital/surgery center.  FAILURE TO FOLLOW THESE INSTRUCTIONS MAY RESULT IN THE CANCELLATION OF YOUR SURGERY  Incentive Spirometer  An incentive spirometer is a tool that can help keep your lungs clear and active. This tool measures how well you are filling your lungs with each breath. Taking long deep breaths may help reverse or decrease the chance of developing breathing (pulmonary) problems (especially infection) following: A long period of time when you are unable to move or be active. BEFORE THE PROCEDURE  If the spirometer includes an indicator to show your best effort, your nurse or respiratory therapist will set it to a desired goal. If possible, sit up straight or lean slightly forward. Try not to slouch. Hold the incentive spirometer in an upright position. INSTRUCTIONS FOR USE  Sit on the edge of your bed if possible, or sit up as far as you can in bed or on a chair. Hold the incentive spirometer in an upright position. Breathe out normally. Place the mouthpiece in your mouth and seal your lips tightly around it. Breathe in slowly and as deeply as possible, raising the piston or the ball  toward the top of the column. Hold your breath for 3-5 seconds or for as long as possible. Allow  the piston or ball to fall to the bottom of the column. Remove the mouthpiece from your mouth and breathe out normally. Rest for a few seconds and repeat Steps 1 through 7 at least 10 times every 1-2 hours when you are awake. Take your time and take a few normal breaths between deep breaths. The spirometer may include an indicator to show your best effort. Use the indicator as a goal to work toward during each repetition. After each set of 10 deep breaths, practice coughing to be sure your lungs are clear. If you have an incision (the cut made at the time of surgery), support your incision when coughing by placing a pillow or rolled up towels firmly against it. Once you are able to get out of bed, walk around indoors and cough well. You may stop using the incentive spirometer when instructed by your caregiver.  RISKS AND COMPLICATIONS Take your time so you do not get dizzy or light-headed. If you are in pain, you may need to take or ask for pain medication before doing incentive spirometry. It is harder to take a deep breath if you are having pain. AFTER USE Rest and breathe slowly and easily. It can be helpful to keep track of a log of your progress. Your caregiver can provide you with a simple table to help with this. If you are using the spirometer at home, follow these instructions: SEEK MEDICAL CARE IF:  You are having difficultly using the spirometer. You have trouble using the spirometer as often as instructed. Your pain medication is not giving enough relief while using the spirometer. You develop fever of 100.5 F (38.1 C) or higher. SEEK IMMEDIATE MEDICAL CARE IF:  You cough up bloody sputum that had not been present before. You develop fever of 102 F (38.9 C) or greater. You develop worsening pain at or near the incision site. MAKE SURE YOU:  Understand these instructions. Will  watch your condition. Will get help right away if you are not doing well or get worse.  WHAT IS A BLOOD TRANSFUSION? Blood Transfusion Information  A transfusion is the replacement of blood or some of its parts. Blood is made up of multiple cells which provide different functions. Red blood cells carry oxygen and are used for blood loss replacement. White blood cells fight against infection. Platelets control bleeding. Plasma helps clot blood. Other blood products are available for specialized needs, such as hemophilia or other clotting disorders. BEFORE THE TRANSFUSION  Who gives blood for transfusions?  Healthy volunteers who are fully evaluated to make sure their blood is safe. This is blood bank blood. Transfusion therapy is the safest it has ever been in the practice of medicine. Before blood is taken from a donor, a complete history is taken to make sure that person has no history of diseases nor engages in risky social behavior (examples are intravenous drug use or sexual activity with multiple partners). The donor's travel history is screened to minimize risk of transmitting infections, such as malaria. The donated blood is tested for signs of infectious diseases, such as HIV and hepatitis. The blood is then tested to be sure it is compatible with you in order to minimize the chance of a transfusion reaction. If you or a relative donates blood, this is often done in anticipation of surgery and is not appropriate for emergency situations. It takes many days to process the donated blood. RISKS AND COMPLICATIONS Although transfusion therapy is very safe and saves many  lives, the main dangers of transfusion include:  Getting an infectious disease. Developing a transfusion reaction. This is an allergic reaction to something in the blood you were given. Every precaution is taken to prevent this. The decision to have a blood transfusion has been considered carefully by your caregiver before blood  is given. Blood is not given unless the benefits outweigh the risks. AFTER THE TRANSFUSION Right after receiving a blood transfusion, you will usually feel much better and more energetic. This is especially true if your red blood cells have gotten low (anemic). The transfusion raises the level of the red blood cells which carry oxygen, and this usually causes an energy increase. The nurse administering the transfusion will monitor you carefully for complications. HOME CARE INSTRUCTIONS  No special instructions are needed after a transfusion. You may find your energy is better. Speak with your caregiver about any limitations on activity for underlying diseases you may have. SEEK MEDICAL CARE IF:  Your condition is not improving after your transfusion. You develop redness or irritation at the intravenous (IV) site. SEEK IMMEDIATE MEDICAL CARE IF:  Any of the following symptoms occur over the next 12 hours: Shaking chills. You have a temperature by mouth above 102 F (38.9 C), not controlled by medicine. Chest, back, or muscle pain. People around you feel you are not acting correctly or are confused. Shortness of breath or difficulty breathing. Dizziness and fainting. You get a rash or develop hives. You have a decrease in urine output. Your urine turns a dark color or changes to pink, red, or brown. Any of the following symptoms occur over the next 10 days: You have a temperature by mouth above 102 F (38.9 C), not controlled by medicine. Shortness of breath. Weakness after normal activity. The white part of the eye turns yellow (jaundice). You have a decrease in the amount of urine or are urinating less often. Your urine turns a dark color or changes to pink, red, or brown. Document Released: 09/24/2000 Document Revised: 12/20/2011 Document Reviewed: 05/13/2008 [facility] Patient Information 2014 [place], [place].

## 2024-07-05 ENCOUNTER — Encounter (HOSPITAL_COMMUNITY)
Admission: RE | Admit: 2024-07-05 | Discharge: 2024-07-05 | Disposition: A | Discharge status: Home / Self Care | Source: Ambulatory Visit | Attending: General Surgery | Admitting: General Surgery

## 2024-07-05 ENCOUNTER — Other Ambulatory Visit: Payer: Self-pay

## 2024-07-05 ENCOUNTER — Encounter (HOSPITAL_COMMUNITY): Payer: Self-pay

## 2024-07-05 VITALS — BP 153/91 | HR 109 | Temp 98.2°F | Resp 16 | Ht 62.0 in | Wt 114.0 lb

## 2024-07-05 DIAGNOSIS — Z794 Long term (current) use of insulin: Secondary | ICD-10-CM | POA: Insufficient documentation

## 2024-07-05 DIAGNOSIS — Z01812 Encounter for preprocedural laboratory examination: Secondary | ICD-10-CM | POA: Insufficient documentation

## 2024-07-05 DIAGNOSIS — E1165 Type 2 diabetes mellitus with hyperglycemia: Secondary | ICD-10-CM | POA: Diagnosis not present

## 2024-07-05 DIAGNOSIS — Z01818 Encounter for other preprocedural examination: Secondary | ICD-10-CM

## 2024-07-05 DIAGNOSIS — I10 Essential (primary) hypertension: Secondary | ICD-10-CM

## 2024-07-05 HISTORY — DX: Anxiety disorder, unspecified: F41.9

## 2024-07-05 LAB — CBC
HCT: 45.7 % (ref 36.0–46.0)
Hemoglobin: 15.5 g/dL — ABNORMAL HIGH (ref 12.0–15.0)
MCH: 31.5 pg (ref 26.0–34.0)
MCHC: 33.9 g/dL (ref 30.0–36.0)
MCV: 92.9 fL (ref 80.0–100.0)
Platelets: 302 K/uL (ref 150–400)
RBC: 4.92 MIL/uL (ref 3.87–5.11)
RDW: 12.9 % (ref 11.5–15.5)
WBC: 14.9 K/uL — ABNORMAL HIGH (ref 4.0–10.5)
nRBC: 0 % (ref 0.0–0.2)

## 2024-07-05 LAB — GLUCOSE, CAPILLARY: Glucose-Capillary: 187 mg/dL — ABNORMAL HIGH (ref 70–99)

## 2024-07-05 LAB — HEMOGLOBIN A1C
Hgb A1c MFr Bld: 5.9 % — ABNORMAL HIGH (ref 4.8–5.6)
Mean Plasma Glucose: 122.63 mg/dL

## 2024-07-05 LAB — BASIC METABOLIC PANEL WITH GFR
Anion gap: 14 (ref 5–15)
BUN: 7 mg/dL (ref 6–20)
CO2: 21 mmol/L — ABNORMAL LOW (ref 22–32)
Calcium: 9.5 mg/dL (ref 8.9–10.3)
Chloride: 104 mmol/L (ref 98–111)
Creatinine, Ser: 0.78 mg/dL (ref 0.44–1.00)
GFR, Estimated: >60 mL/min (ref >60)
Glucose, Bld: 134 mg/dL — ABNORMAL HIGH (ref 70–99)
Potassium: 3.3 mmol/L — ABNORMAL LOW (ref 3.5–5.1)
Sodium: 138 mmol/L (ref 135–145)

## 2024-07-09 DIAGNOSIS — Z299 Encounter for prophylactic measures, unspecified: Secondary | ICD-10-CM | POA: Diagnosis not present

## 2024-07-09 DIAGNOSIS — E119 Type 2 diabetes mellitus without complications: Secondary | ICD-10-CM | POA: Diagnosis not present

## 2024-07-09 DIAGNOSIS — R000 Tachycardia, unspecified: Secondary | ICD-10-CM | POA: Diagnosis not present

## 2024-07-10 DIAGNOSIS — E1142 Type 2 diabetes mellitus with diabetic polyneuropathy: Secondary | ICD-10-CM | POA: Diagnosis not present

## 2024-07-10 DIAGNOSIS — M79674 Pain in right toe(s): Secondary | ICD-10-CM | POA: Diagnosis not present

## 2024-07-10 DIAGNOSIS — M79675 Pain in left toe(s): Secondary | ICD-10-CM | POA: Diagnosis not present

## 2024-07-10 DIAGNOSIS — B351 Tinea unguium: Secondary | ICD-10-CM | POA: Diagnosis not present

## 2024-07-10 DIAGNOSIS — L84 Corns and callosities: Secondary | ICD-10-CM | POA: Diagnosis not present

## 2024-07-12 ENCOUNTER — Encounter (HOSPITAL_COMMUNITY)
Admission: RE | Disposition: A | Discharge status: Home / Self Care | Payer: Self-pay | Source: Home / Self Care | Attending: General Surgery

## 2024-07-12 ENCOUNTER — Other Ambulatory Visit: Payer: Self-pay

## 2024-07-12 ENCOUNTER — Encounter (HOSPITAL_COMMUNITY): Payer: Self-pay | Admitting: General Surgery

## 2024-07-12 ENCOUNTER — Inpatient Hospital Stay (HOSPITAL_COMMUNITY): Admitting: Registered Nurse

## 2024-07-12 ENCOUNTER — Inpatient Hospital Stay (HOSPITAL_COMMUNITY)
Admission: RE | Admit: 2024-07-12 | Discharge: 2024-07-14 | DRG: 331 | Disposition: A | Discharge status: Home / Self Care | Attending: General Surgery | Admitting: General Surgery

## 2024-07-12 DIAGNOSIS — Z882 Allergy status to sulfonamides status: Secondary | ICD-10-CM | POA: Diagnosis not present

## 2024-07-12 DIAGNOSIS — Z833 Family history of diabetes mellitus: Secondary | ICD-10-CM | POA: Diagnosis not present

## 2024-07-12 DIAGNOSIS — K6389 Other specified diseases of intestine: Principal | ICD-10-CM | POA: Diagnosis present

## 2024-07-12 DIAGNOSIS — E785 Hyperlipidemia, unspecified: Secondary | ICD-10-CM | POA: Diagnosis not present

## 2024-07-12 DIAGNOSIS — Z7982 Long term (current) use of aspirin: Secondary | ICD-10-CM | POA: Diagnosis not present

## 2024-07-12 DIAGNOSIS — E782 Mixed hyperlipidemia: Secondary | ICD-10-CM | POA: Diagnosis present

## 2024-07-12 DIAGNOSIS — Z7984 Long term (current) use of oral hypoglycemic drugs: Secondary | ICD-10-CM

## 2024-07-12 DIAGNOSIS — F17210 Nicotine dependence, cigarettes, uncomplicated: Secondary | ICD-10-CM | POA: Diagnosis not present

## 2024-07-12 DIAGNOSIS — Z808 Family history of malignant neoplasm of other organs or systems: Secondary | ICD-10-CM | POA: Diagnosis not present

## 2024-07-12 DIAGNOSIS — D123 Benign neoplasm of transverse colon: Secondary | ICD-10-CM | POA: Diagnosis not present

## 2024-07-12 DIAGNOSIS — K561 Intussusception: Secondary | ICD-10-CM | POA: Diagnosis not present

## 2024-07-12 DIAGNOSIS — Z90710 Acquired absence of both cervix and uterus: Secondary | ICD-10-CM

## 2024-07-12 DIAGNOSIS — Z79899 Other long term (current) drug therapy: Secondary | ICD-10-CM | POA: Diagnosis not present

## 2024-07-12 DIAGNOSIS — D175 Benign lipomatous neoplasm of intra-abdominal organs: Secondary | ICD-10-CM | POA: Diagnosis not present

## 2024-07-12 DIAGNOSIS — Z888 Allergy status to other drugs, medicaments and biological substances status: Secondary | ICD-10-CM

## 2024-07-12 DIAGNOSIS — E119 Type 2 diabetes mellitus without complications: Secondary | ICD-10-CM | POA: Diagnosis not present

## 2024-07-12 DIAGNOSIS — Z860100 Personal history of colon polyps, unspecified: Secondary | ICD-10-CM

## 2024-07-12 HISTORY — PX: COLONOSCOPY: SHX5424

## 2024-07-12 LAB — TYPE AND SCREEN
ABO/RH(D): O POS
Antibody Screen: NEGATIVE

## 2024-07-12 LAB — GLUCOSE, CAPILLARY
Glucose-Capillary: 183 mg/dL — ABNORMAL HIGH (ref 70–99)
Glucose-Capillary: 187 mg/dL — ABNORMAL HIGH (ref 70–99)
Glucose-Capillary: 249 mg/dL — ABNORMAL HIGH (ref 70–99)
Glucose-Capillary: 238 mg/dL — ABNORMAL HIGH (ref 70–99)

## 2024-07-12 LAB — ABO/RH: ABO/RH(D): O POS

## 2024-07-12 SURGERY — COLECTOMY, PARTIAL, ROBOT-ASSISTED, LAPAROSCOPIC
Anesthesia: General | Site: Abdomen

## 2024-07-12 MED ORDER — EPHEDRINE 5 MG/ML INJ
INTRAVENOUS | Status: AC
  Filled 2024-07-12: qty 5

## 2024-07-12 MED ORDER — PANTOPRAZOLE SODIUM 40 MG PO TBEC
80.0000 mg | DELAYED_RELEASE_TABLET | Freq: Every day | ORAL | Status: DC
  Administered 2024-07-13 – 2024-07-14 (×2): 80 mg via ORAL
  Filled 2024-07-12 (×2): qty 2

## 2024-07-12 MED ORDER — ACETAMINOPHEN 500 MG PO TABS
1000.0000 mg | ORAL_TABLET | ORAL | Status: AC
  Administered 2024-07-12: 1000 mg via ORAL
  Filled 2024-07-12: qty 2

## 2024-07-12 MED ORDER — DEXAMETHASONE SODIUM PHOSPHATE 10 MG/ML IJ SOLN
INTRAMUSCULAR | Status: AC
  Filled 2024-07-12: qty 1

## 2024-07-12 MED ORDER — DIPHENHYDRAMINE HCL 50 MG/ML IJ SOLN: 12.5000 mg | Freq: Four times a day (QID) | INTRAMUSCULAR | Status: DC | PRN

## 2024-07-12 MED ORDER — PHENYLEPHRINE 80 MCG/ML (10ML) SYRINGE FOR IV PUSH (FOR BLOOD PRESSURE SUPPORT)
PREFILLED_SYRINGE | INTRAVENOUS | Status: DC | PRN
Start: 2024-07-12 — End: 2024-07-12
  Administered 2024-07-12 (×6): 160 ug via INTRAVENOUS

## 2024-07-12 MED ORDER — TRAZODONE HCL 50 MG PO TABS
150.0000 mg | ORAL_TABLET | Freq: Every day | ORAL | Status: DC
  Administered 2024-07-12 – 2024-07-13 (×2): 150 mg via ORAL
  Filled 2024-07-12 (×2): qty 1

## 2024-07-12 MED ORDER — PROPOFOL 10 MG/ML IV BOLUS
INTRAVENOUS | Status: AC
  Filled 2024-07-12: qty 20

## 2024-07-12 MED ORDER — PHENYLEPHRINE 80 MCG/ML (10ML) SYRINGE FOR IV PUSH (FOR BLOOD PRESSURE SUPPORT)
PREFILLED_SYRINGE | INTRAVENOUS | Status: AC
  Filled 2024-07-12: qty 10

## 2024-07-12 MED ORDER — INSULIN ASPART 100 UNIT/ML IJ SOLN
0.0000 [IU] | Freq: Every day | INTRAMUSCULAR | Status: DC
  Administered 2024-07-12: 2 [IU] via SUBCUTANEOUS

## 2024-07-12 MED ORDER — ROCURONIUM BROMIDE 10 MG/ML (PF) SYRINGE
PREFILLED_SYRINGE | INTRAVENOUS | Status: AC
Start: 2024-07-12 — End: 2024-07-12
  Filled 2024-07-12: qty 10

## 2024-07-12 MED ORDER — LACTATED RINGERS IR SOLN
Status: DC | PRN
  Administered 2024-07-12: 1000 mL

## 2024-07-12 MED ORDER — BISACODYL 5 MG PO TBEC: 20.0000 mg | DELAYED_RELEASE_TABLET | Freq: Once | ORAL | Status: DC

## 2024-07-12 MED ORDER — FENTANYL CITRATE (PF) 250 MCG/5ML IJ SOLN
INTRAMUSCULAR | Status: AC
  Filled 2024-07-12: qty 5

## 2024-07-12 MED ORDER — ONDANSETRON HCL 4 MG/2ML IJ SOLN
INTRAMUSCULAR | Status: AC
  Filled 2024-07-12: qty 2

## 2024-07-12 MED ORDER — ATORVASTATIN CALCIUM 20 MG PO TABS
40.0000 mg | ORAL_TABLET | Freq: Every day | ORAL | Status: DC
  Administered 2024-07-12 – 2024-07-13 (×2): 40 mg via ORAL
  Filled 2024-07-12 (×2): qty 2

## 2024-07-12 MED ORDER — ALUM & MAG HYDROXIDE-SIMETH 200-200-20 MG/5ML PO SUSP: 30.0000 mL | Freq: Four times a day (QID) | ORAL | Status: DC | PRN

## 2024-07-12 MED ORDER — GABAPENTIN 100 MG PO CAPS
300.0000 mg | ORAL_CAPSULE | Freq: Two times a day (BID) | ORAL | Status: DC
Start: 2024-07-12 — End: 2024-07-14
  Administered 2024-07-12 – 2024-07-14 (×4): 300 mg via ORAL
  Filled 2024-07-12 (×4): qty 3

## 2024-07-12 MED ORDER — ONDANSETRON HCL 4 MG/2ML IJ SOLN
INTRAMUSCULAR | Status: DC | PRN
  Administered 2024-07-12: 4 mg via INTRAVENOUS

## 2024-07-12 MED ORDER — LIDOCAINE HCL (PF) 2 % IJ SOLN
INTRAMUSCULAR | Status: AC
  Filled 2024-07-12: qty 5

## 2024-07-12 MED ORDER — RISPERIDONE 1 MG PO TABS
2.0000 mg | ORAL_TABLET | Freq: Every day | ORAL | Status: DC
  Administered 2024-07-12 – 2024-07-13 (×2): 2 mg via ORAL
  Filled 2024-07-12 (×2): qty 2

## 2024-07-12 MED ORDER — LACTATED RINGERS IV SOLN: INTRAVENOUS | Status: DC | PRN

## 2024-07-12 MED ORDER — PSYLLIUM 95 % PO PACK
1.0000 | PACK | Freq: Two times a day (BID) | ORAL | Status: DC
Start: 2024-07-12 — End: 2024-07-14
  Administered 2024-07-12 – 2024-07-13 (×2): 1 via ORAL
  Filled 2024-07-12 (×4): qty 1

## 2024-07-12 MED ORDER — PROPOFOL 10 MG/ML IV BOLUS
INTRAVENOUS | Status: DC | PRN
  Administered 2024-07-12: 130 mg via INTRAVENOUS

## 2024-07-12 MED ORDER — LISINOPRIL 5 MG PO TABS
2.5000 mg | ORAL_TABLET | Freq: Every morning | ORAL | Status: DC
  Administered 2024-07-13 – 2024-07-14 (×2): 2.5 mg via ORAL
  Filled 2024-07-12 (×2): qty 1

## 2024-07-12 MED ORDER — SIMETHICONE 80 MG PO CHEW: 40.0000 mg | CHEWABLE_TABLET | Freq: Four times a day (QID) | ORAL | Status: DC | PRN

## 2024-07-12 MED ORDER — INSULIN ASPART 100 UNIT/ML IJ SOLN
0.0000 [IU] | INTRAMUSCULAR | Status: DC | PRN
  Administered 2024-07-12: 4 [IU] via SUBCUTANEOUS
  Filled 2024-07-12 (×2): qty 1

## 2024-07-12 MED ORDER — POTASSIUM 99 MG PO TABS: 99.0000 mg | ORAL_TABLET | Freq: Every day | ORAL | Status: DC

## 2024-07-12 MED ORDER — DROPERIDOL 2.5 MG/ML IJ SOLN: 0.6250 mg | Freq: Once | INTRAMUSCULAR | Status: DC | PRN

## 2024-07-12 MED ORDER — BUPIVACAINE-EPINEPHRINE (PF) 0.25% -1:200000 IJ SOLN
INTRAMUSCULAR | Status: AC
Start: 2024-07-12 — End: 2024-07-12
  Filled 2024-07-12: qty 60

## 2024-07-12 MED ORDER — ORAL CARE MOUTH RINSE: 15.0000 mL | Freq: Once | OROMUCOSAL | Status: AC

## 2024-07-12 MED ORDER — HYDROMORPHONE HCL 1 MG/ML IJ SOLN: 0.5000 mg | INTRAMUSCULAR | Status: DC | PRN

## 2024-07-12 MED ORDER — RISPERIDONE 1 MG PO TABS
1.0000 mg | ORAL_TABLET | Freq: Every day | ORAL | Status: DC
  Administered 2024-07-13 – 2024-07-14 (×2): 1 mg via ORAL
  Filled 2024-07-12 (×2): qty 1

## 2024-07-12 MED ORDER — INSULIN ASPART 100 UNIT/ML IJ SOLN
0.0000 [IU] | Freq: Three times a day (TID) | INTRAMUSCULAR | Status: DC
  Administered 2024-07-12: 7 [IU] via SUBCUTANEOUS
  Administered 2024-07-13: 11 [IU] via SUBCUTANEOUS
  Administered 2024-07-13: 7 [IU] via SUBCUTANEOUS
  Administered 2024-07-14: 3 [IU] via SUBCUTANEOUS

## 2024-07-12 MED ORDER — POLYETHYLENE GLYCOL 3350 17 GM/SCOOP PO POWD: 238.0000 g | Freq: Once | ORAL | Status: DC

## 2024-07-12 MED ORDER — BUSPIRONE HCL 5 MG PO TABS
10.0000 mg | ORAL_TABLET | Freq: Two times a day (BID) | ORAL | Status: DC
  Administered 2024-07-12 – 2024-07-14 (×4): 10 mg via ORAL
  Filled 2024-07-12 (×4): qty 2

## 2024-07-12 MED ORDER — ALVIMOPAN 12 MG PO CAPS
12.0000 mg | ORAL_CAPSULE | Freq: Two times a day (BID) | ORAL | Status: DC
  Administered 2024-07-13: 12 mg via ORAL
  Filled 2024-07-12: qty 1

## 2024-07-12 MED ORDER — ENSURE PRE-SURGERY PO LIQD
296.0000 mL | Freq: Once | ORAL | Status: DC
  Filled 2024-07-12: qty 296

## 2024-07-12 MED ORDER — ACETAMINOPHEN 500 MG PO TABS
1000.0000 mg | ORAL_TABLET | Freq: Four times a day (QID) | ORAL | Status: DC
  Administered 2024-07-12 – 2024-07-14 (×7): 1000 mg via ORAL
  Filled 2024-07-12 (×7): qty 2

## 2024-07-12 MED ORDER — GABAPENTIN 300 MG PO CAPS
300.0000 mg | ORAL_CAPSULE | ORAL | Status: AC
Start: 2024-07-12 — End: 2024-07-12
  Administered 2024-07-12: 300 mg via ORAL
  Filled 2024-07-12: qty 1

## 2024-07-12 MED ORDER — ENSURE PRE-SURGERY PO LIQD
592.0000 mL | Freq: Once | ORAL | Status: DC
  Filled 2024-07-12: qty 592

## 2024-07-12 MED ORDER — ONDANSETRON HCL 4 MG PO TABS: 4.0000 mg | ORAL_TABLET | Freq: Four times a day (QID) | ORAL | Status: DC | PRN

## 2024-07-12 MED ORDER — KCL IN DEXTROSE-NACL 20-5-0.45 MEQ/L-%-% IV SOLN
INTRAVENOUS | Status: DC
  Filled 2024-07-12: qty 1000

## 2024-07-12 MED ORDER — TRAMADOL HCL 50 MG PO TABS
50.0000 mg | ORAL_TABLET | Freq: Four times a day (QID) | ORAL | Status: DC | PRN
  Administered 2024-07-12: 50 mg via ORAL
  Administered 2024-07-13: 100 mg via ORAL
  Filled 2024-07-12: qty 2
  Filled 2024-07-12: qty 1

## 2024-07-12 MED ORDER — ALBUMIN HUMAN 5 % IV SOLN: INTRAVENOUS | Status: DC | PRN

## 2024-07-12 MED ORDER — ROCURONIUM BROMIDE 100 MG/10ML IV SOLN
INTRAVENOUS | Status: DC | PRN
  Administered 2024-07-12: 60 mg via INTRAVENOUS
  Administered 2024-07-12: 10 mg via INTRAVENOUS

## 2024-07-12 MED ORDER — FENTANYL CITRATE (PF) 100 MCG/2ML IJ SOLN
INTRAMUSCULAR | Status: DC | PRN
  Administered 2024-07-12: 50 ug via INTRAVENOUS
  Administered 2024-07-12: 100 ug via INTRAVENOUS

## 2024-07-12 MED ORDER — PHENYLEPHRINE HCL-NACL 20-0.9 MG/250ML-% IV SOLN
INTRAVENOUS | Status: AC
  Filled 2024-07-12: qty 250

## 2024-07-12 MED ORDER — PAROXETINE HCL 20 MG PO TABS
40.0000 mg | ORAL_TABLET | Freq: Every day | ORAL | Status: DC
  Administered 2024-07-13 – 2024-07-14 (×2): 40 mg via ORAL
  Filled 2024-07-12 (×2): qty 2

## 2024-07-12 MED ORDER — FAMOTIDINE 20 MG PO TABS
20.0000 mg | ORAL_TABLET | Freq: Every day | ORAL | Status: DC
Start: 2024-07-12 — End: 2024-07-14
  Administered 2024-07-12 – 2024-07-14 (×3): 20 mg via ORAL
  Filled 2024-07-12 (×3): qty 1

## 2024-07-12 MED ORDER — LACTATED RINGERS IV SOLN: INTRAVENOUS | Status: DC

## 2024-07-12 MED ORDER — SODIUM CHLORIDE 0.9 % IV SOLN
2.0000 g | INTRAVENOUS | Status: AC
  Administered 2024-07-12: 2 g via INTRAVENOUS
  Filled 2024-07-12: qty 2

## 2024-07-12 MED ORDER — SUGAMMADEX SODIUM 200 MG/2ML IV SOLN
INTRAVENOUS | Status: DC | PRN
Start: 2024-07-12 — End: 2024-07-12
  Administered 2024-07-12: 200 mg via INTRAVENOUS

## 2024-07-12 MED ORDER — DIPHENHYDRAMINE HCL 12.5 MG/5ML PO ELIX: 12.5000 mg | ORAL_SOLUTION | Freq: Four times a day (QID) | ORAL | Status: DC | PRN

## 2024-07-12 MED ORDER — SODIUM CHLORIDE 0.9 % IV SOLN
2.0000 g | Freq: Two times a day (BID) | INTRAVENOUS | Status: AC
  Administered 2024-07-12: 2 g via INTRAVENOUS
  Filled 2024-07-12: qty 2

## 2024-07-12 MED ORDER — BUPIVACAINE-EPINEPHRINE 0.25% -1:200000 IJ SOLN
INTRAMUSCULAR | Status: DC | PRN
  Administered 2024-07-12: 55 mL

## 2024-07-12 MED ORDER — ENSURE SURGERY PO LIQD
237.0000 mL | Freq: Two times a day (BID) | ORAL | Status: DC
Start: 2024-07-13 — End: 2024-07-14

## 2024-07-12 MED ORDER — ENOXAPARIN SODIUM 40 MG/0.4ML IJ SOSY
40.0000 mg | PREFILLED_SYRINGE | INTRAMUSCULAR | Status: DC
  Administered 2024-07-13 – 2024-07-14 (×2): 40 mg via SUBCUTANEOUS
  Filled 2024-07-12 (×2): qty 0.4

## 2024-07-12 MED ORDER — OXCARBAZEPINE 300 MG PO TABS
300.0000 mg | ORAL_TABLET | Freq: Every day | ORAL | Status: DC
  Administered 2024-07-12 – 2024-07-13 (×2): 300 mg via ORAL
  Filled 2024-07-12 (×2): qty 1

## 2024-07-12 MED ORDER — LIDOCAINE HCL (CARDIAC) PF 100 MG/5ML IV SOSY
PREFILLED_SYRINGE | INTRAVENOUS | Status: DC | PRN
  Administered 2024-07-12: 40 mg via INTRAVENOUS

## 2024-07-12 MED ORDER — METFORMIN HCL 500 MG PO TABS
1000.0000 mg | ORAL_TABLET | Freq: Two times a day (BID) | ORAL | Status: DC
Start: 2024-07-13 — End: 2024-07-14
  Administered 2024-07-13 – 2024-07-14 (×3): 1000 mg via ORAL
  Filled 2024-07-12 (×3): qty 2

## 2024-07-12 MED ORDER — FENTANYL CITRATE PF 50 MCG/ML IJ SOSY: 25.0000 ug | PREFILLED_SYRINGE | INTRAMUSCULAR | Status: DC | PRN

## 2024-07-12 MED ORDER — MIDAZOLAM HCL 2 MG/2ML IJ SOLN
INTRAMUSCULAR | Status: AC
  Filled 2024-07-12: qty 2

## 2024-07-12 MED ORDER — SACCHAROMYCES BOULARDII 250 MG PO CAPS
250.0000 mg | ORAL_CAPSULE | Freq: Two times a day (BID) | ORAL | Status: DC
  Administered 2024-07-12 – 2024-07-14 (×4): 250 mg via ORAL
  Filled 2024-07-12 (×4): qty 1

## 2024-07-12 MED ORDER — MIDAZOLAM HCL 5 MG/5ML IJ SOLN
INTRAMUSCULAR | Status: DC | PRN
  Administered 2024-07-12: 1 mg via INTRAVENOUS

## 2024-07-12 MED ORDER — PHENYLEPHRINE HCL-NACL 20-0.9 MG/250ML-% IV SOLN
INTRAVENOUS | Status: DC | PRN
Start: 2024-07-12 — End: 2024-07-12
  Administered 2024-07-12: 40 ug/min via INTRAVENOUS

## 2024-07-12 MED ORDER — ALBUMIN HUMAN 5 % IV SOLN
INTRAVENOUS | Status: AC
  Filled 2024-07-12: qty 500

## 2024-07-12 MED ORDER — ROCURONIUM BROMIDE 10 MG/ML (PF) SYRINGE
PREFILLED_SYRINGE | INTRAVENOUS | Status: AC
  Filled 2024-07-12: qty 10

## 2024-07-12 MED ORDER — ONDANSETRON HCL 4 MG/2ML IJ SOLN: 4.0000 mg | Freq: Four times a day (QID) | INTRAMUSCULAR | Status: DC | PRN

## 2024-07-12 MED ORDER — 0.9 % SODIUM CHLORIDE (POUR BTL) OPTIME
TOPICAL | Status: DC | PRN
  Administered 2024-07-12: 2000 mL

## 2024-07-12 MED ORDER — EPHEDRINE SULFATE-NACL 50-0.9 MG/10ML-% IV SOSY
PREFILLED_SYRINGE | INTRAVENOUS | Status: DC | PRN
  Administered 2024-07-12: 5 mg via INTRAVENOUS

## 2024-07-12 MED ORDER — CHLORHEXIDINE GLUCONATE 0.12 % MT SOLN
15.0000 mL | Freq: Once | OROMUCOSAL | Status: AC
  Administered 2024-07-12: 15 mL via OROMUCOSAL

## 2024-07-12 MED ORDER — ALVIMOPAN 12 MG PO CAPS
12.0000 mg | ORAL_CAPSULE | ORAL | Status: AC
  Administered 2024-07-12: 12 mg via ORAL
  Filled 2024-07-12: qty 1

## 2024-07-12 SURGICAL SUPPLY — 66 items
BAG COUNTER SPONGE SURGICOUNT (BAG) ×2 IMPLANT
BLADE EXTENDED COATED 6.5IN (ELECTRODE) IMPLANT
CANNULA REDUCER 12-8 DVNC XI (CANNULA) IMPLANT
CELLS DAT CNTRL 66122 CELL SVR (MISCELLANEOUS) IMPLANT
COVER SURGICAL LIGHT HANDLE (MISCELLANEOUS) ×4 IMPLANT
COVER TIP SHEARS 8 DVNC (MISCELLANEOUS) ×2 IMPLANT
DEFOGGER SCOPE WARM SEASHARP (MISCELLANEOUS) ×2 IMPLANT
DRAIN CHANNEL 19F RND (DRAIN) IMPLANT
DRAPE ARM DVNC X/XI (DISPOSABLE) ×8 IMPLANT
DRAPE COLUMN DVNC XI (DISPOSABLE) ×2 IMPLANT
DRAPE CV SPLIT W-CLR ANES SCRN (DRAPES) ×2 IMPLANT
DRAPE PERI GROIN 82X75IN TIB (DRAPES) ×2 IMPLANT
DRAPE SURG IRRIG POUCH 19X23 (DRAPES) ×2 IMPLANT
DRIVER NDL LRG 8 DVNC XI (INSTRUMENTS) ×2 IMPLANT
DRIVER NDLE LRG 8 DVNC XI (INSTRUMENTS) ×2 IMPLANT
DRSG OPSITE POSTOP 4X6 (GAUZE/BANDAGES/DRESSINGS) IMPLANT
DRSG OPSITE POSTOP 4X8 (GAUZE/BANDAGES/DRESSINGS) IMPLANT
ELECT PENCIL ROCKER SW 15FT (MISCELLANEOUS) ×2 IMPLANT
ELECT REM PT RETURN 15FT ADLT (MISCELLANEOUS) ×2 IMPLANT
EVACUATOR SILICONE 100CC (DRAIN) IMPLANT
GLOVE BIO SURGEON STRL SZ 6.5 (GLOVE) ×6 IMPLANT
GLOVE INDICATOR 6.5 STRL GRN (GLOVE) ×6 IMPLANT
GOWN SRG XL LVL 4 BRTHBL STRL (GOWNS) ×2 IMPLANT
GOWN STRL REUS W/ TWL XL LVL3 (GOWN DISPOSABLE) ×6 IMPLANT
GRASPER SUT TROCAR 14GX15 (MISCELLANEOUS) IMPLANT
GRASPER TIP-UP FEN DVNC XI (INSTRUMENTS) ×2 IMPLANT
HOLDER FOLEY CATH W/STRAP (MISCELLANEOUS) ×2 IMPLANT
IRRIGATION SUCT STRKRFLW 2 WTP (MISCELLANEOUS) ×2 IMPLANT
KIT PROCEDURE DVNC SI (MISCELLANEOUS) IMPLANT
KIT TURNOVER KIT A (KITS) ×2 IMPLANT
NDL INSUFFLATION 14GA 120MM (NEEDLE) ×2 IMPLANT
NEEDLE INSUFFLATION 14GA 120MM (NEEDLE) ×2 IMPLANT
PACK COLON (CUSTOM PROCEDURE TRAY) ×2 IMPLANT
PAD POSITIONING PINK XL (MISCELLANEOUS) ×2 IMPLANT
RELOAD STAPLE 60 3.5 BLU DVNC (STAPLE) IMPLANT
RELOAD STAPLE 60 4.3 GRN DVNC (STAPLE) IMPLANT
RETRACTOR WND ALEXIS 18 MED (MISCELLANEOUS) IMPLANT
SCISSORS LAP 5X35 DISP (ENDOMECHANICALS) IMPLANT
SCISSORS MNPLR CVD DVNC XI (INSTRUMENTS) ×2 IMPLANT
SEAL UNIV 5-12 XI (MISCELLANEOUS) ×6 IMPLANT
SEALER VESSEL EXT DVNC XI (MISCELLANEOUS) ×2 IMPLANT
SOLUTION ELECTROSURG ANTI STCK (MISCELLANEOUS) ×2 IMPLANT
SPIKE FLUID TRANSFER (MISCELLANEOUS) IMPLANT
STAPLER 60 SUREFORM DVNC (STAPLE) IMPLANT
STAPLER ECHELON POWER CIR 29 (STAPLE) IMPLANT
STAPLER ECHELON POWER CIR 31 (STAPLE) IMPLANT
SUT ETHILON 2 0 PS N (SUTURE) IMPLANT
SUT NOVA NAB GS-21 1 T12 (SUTURE) ×4 IMPLANT
SUT PROLENE 2 0 KS (SUTURE) IMPLANT
SUT SILK 2 0 SH CR/8 (SUTURE) IMPLANT
SUT SILK 2-0 18XBRD TIE 12 (SUTURE) ×2 IMPLANT
SUT SILK 3 0 SH CR/8 (SUTURE) ×2 IMPLANT
SUT SILK 3-0 18XBRD TIE 12 (SUTURE) IMPLANT
SUT VIC AB 2-0 SH 18 (SUTURE) IMPLANT
SUT VIC AB 2-0 SH 27X BRD (SUTURE) IMPLANT
SUT VIC AB 3-0 SH 18 (SUTURE) IMPLANT
SUT VIC AB 4-0 PS2 27 (SUTURE) ×4 IMPLANT
SUT VICRYL 0 UR6 27IN ABS (SUTURE) ×2 IMPLANT
SUTURE V-LC BRB 180 2/0GR6GS22 (SUTURE) IMPLANT
SYR 20ML ECCENTRIC (SYRINGE) ×2 IMPLANT
SYSTEM WOUND ALEXIS 18CM MED (MISCELLANEOUS) IMPLANT
TOWEL OR 17X26 10 PK STRL BLUE (TOWEL DISPOSABLE) IMPLANT
TRAY FOLEY MTR SLVR 16FR STAT (SET/KITS/TRAYS/PACK) ×2 IMPLANT
TROCAR ADV FIXATION 5X100MM (TROCAR) ×2 IMPLANT
TUBING CONNECTING 10 (TUBING) ×4 IMPLANT
TUBING INSUFFLATION 10FT LAP (TUBING) ×2 IMPLANT

## 2024-07-12 NOTE — Anesthesia Procedure Notes (Signed)
 Procedure Name: Intubation Date/Time: 07/12/2024 12:07 PM  Performed by: Christopher Comings, CRNAPre-anesthesia Checklist: Patient identified, Emergency Drugs available, Suction available and Patient being monitored Patient Re-evaluated:Patient Re-evaluated prior to induction Oxygen Delivery Method: Circle system utilized Preoxygenation: Pre-oxygenation with 100% oxygen Induction Type: IV induction Ventilation: Mask ventilation without difficulty Laryngoscope Size: Mac and 4 Grade View: Grade I Tube type: Oral Tube size: 7.0 mm Number of attempts: 1 Airway Equipment and Method: Stylet and Oral airway Placement Confirmation: ETT inserted through vocal cords under direct vision, positive ETCO2 and breath sounds checked- equal and bilateral Secured at: 22 cm Tube secured with: Tape Dental Injury: Teeth and Oropharynx as per pre-operative assessment  Comments: Pt intubated by Paramedic RN.

## 2024-07-12 NOTE — Anesthesia Postprocedure Evaluation (Signed)
 Anesthesia Post Note  Patient: [name]  Procedure(s) Performed: COLECTOMY, PARTIAL, ROBOT-ASSISTED, LAPAROSCOPIC (Abdomen) COLONOSCOPY     Patient location during evaluation: PACU Anesthesia Type: General Level of consciousness: awake and alert Pain management: pain level controlled Vital Signs Assessment: post-procedure vital signs reviewed and stable Respiratory status: spontaneous breathing, nonlabored ventilation, respiratory function stable and patient connected to nasal cannula oxygen Cardiovascular status: blood pressure returned to baseline and stable Postop Assessment: no apparent nausea or vomiting Anesthetic complications: no   There were no known notable events for this encounter.  Last Vitals:  Vitals:   07/12/24 1617 07/12/24 1653  BP: (!) 152/85 (!) 146/90  Pulse: (!) 102 (!) 102  Resp: 15   Temp: 36.6 C   SpO2: 97% 98%    Last Pain:  Vitals:   07/12/24 1656  TempSrc:   PainSc: 6                  Victoriah Wilds P Donzella Carrol

## 2024-07-12 NOTE — Transfer of Care (Signed)
 Immediate Anesthesia Transfer of Care Note  Patient: Laura Suarez  Procedure(s) Performed: COLECTOMY, PARTIAL, ROBOT-ASSISTED, LAPAROSCOPIC (Abdomen) COLONOSCOPY  Patient Location: PACU  Anesthesia Type:General  Level of Consciousness: drowsy  Airway & Oxygen Therapy: Patient connected to face mask oxygen  Post-op Assessment: Report given to RN and Post -op Vital signs reviewed and stable  Post vital signs: Reviewed and stable  Last Vitals:  Vitals Value Taken Time  BP 167/85 07/12/24 14:30  Temp    Pulse 105 07/12/24 14:31  Resp 15 07/12/24 14:31  SpO2 100 % 07/12/24 14:31  Vitals shown include unfiled device data.  Last Pain:  Vitals:   07/12/24 0938  TempSrc: Oral         Complications: There were no known notable events for this encounter.

## 2024-07-12 NOTE — Discharge Instructions (Signed)

## 2024-07-12 NOTE — Anesthesia Preprocedure Evaluation (Addendum)
 Anesthesia Evaluation  Patient identified by MRN, date of birth, ID band Patient awake    Reviewed: Allergy & Precautions, NPO status , Patient's Chart, lab work & pertinent test results  Airway Mallampati: II  TM Distance: >3 FB Neck ROM: Full    Dental no notable dental hx.    Pulmonary Current Smoker   Pulmonary exam normal        Cardiovascular  Rhythm:Regular Rate:Normal     Neuro/Psych   Anxiety Depression Bipolar Disorder      GI/Hepatic Neg liver ROS,GERD  ,,  Endo/Other  diabetes, Type 2    Renal/GU negative Renal ROS  negative genitourinary   Musculoskeletal negative musculoskeletal ROS (+)    Abdominal Normal abdominal exam  (+)   Peds  Hematology negative hematology ROS (+)   Anesthesia Other Findings   Reproductive/Obstetrics                              Anesthesia Physical Anesthesia Plan  ASA: 2  Anesthesia Plan: General   Post-op Pain Management:    Induction: Intravenous  PONV Risk Score and Plan: Ondansetron , Dexamethasone , Midazolam  and Treatment may vary due to age or medical condition  Airway Management Planned: Mask and Oral ETT  Additional Equipment: None  Intra-op Plan:   Post-operative Plan: Extubation in OR  Informed Consent: I have reviewed the patients History and Physical, chart, labs and discussed the procedure including the risks, benefits and alternatives for the proposed anesthesia with the patient or authorized representative who has indicated his/her understanding and acceptance.     Dental advisory given  Plan Discussed with: CRNA  Anesthesia Plan Comments:         Anesthesia Quick Evaluation

## 2024-07-12 NOTE — Plan of Care (Signed)
  Problem: Education: Goal: Understanding of discharge needs will improve Outcome: Progressing Goal: Verbalization of understanding of the causes of altered bowel function will improve Outcome: Progressing   Problem: Activity: Goal: Ability to tolerate increased activity will improve Outcome: Progressing

## 2024-07-12 NOTE — H&P (Signed)
 REFERRING PHYSICIAN:  Cinderella Deatrice FALCON, MD   PROVIDER:  BERNARDA WANDA NED, MD   MRN: [id number] DOB: 18-Sep-1966    Subjective    Chief Complaint:  colon mass      History of Present Illness: Laura Suarez is a 58 y.o. female who is seen today as an office consultation at the request of Dr. Cinderella for evaluation of No chief complaint on file. .  58 year old female who underwent colonoscopy on May 24, 2024 for high risk surveillance due to a personal history of colon polyps.  1 polyp was removed from ascending colon.  There was an umbilicated centrally ulcerated polypoid submucosal lesion noted in the proximal transverse colon.  This was tattooed and biopsied.  Lesion was seen approximately 1 year ago and noted some superficial hyperplastic changes.  It appeared to be larger on recent examination.  This was biopsied again and tattooed.  Biopsies show some noninflammatory hyperplastic changes.  She has a surgical history of hysterectomy and tubal ligation.     Review of Systems: A complete review of systems was obtained from the patient.  I have reviewed this information and discussed as appropriate with the patient.  See HPI as well for other ROS.     Medical History:     Past Medical History:  Diagnosis Date   Anxiety     Diabetes mellitus without complication (CMS/HHS-HCC)     GERD (gastroesophageal reflux disease)     Hyperlipidemia        There is no problem list on file for this patient.          Past Surgical History:  Procedure Laterality Date   HYSTERECTOMY              Allergies  Allergen Reactions   Sulfa (Sulfonamide Antibiotics) Rash            Current Outpatient Medications on File Prior to Visit  Medication Sig Dispense Refill   busPIRone (BUSPAR) 10 MG tablet Take 10 mg by mouth 3 (three) times daily       FARXIGA 5 mg tablet Take 5 mg by mouth every morning       HYDROcodone -acetaminophen  (NORCO) 5-325 mg tablet Take 1 tablet by  mouth every 6 (six) hours as needed       lisinopriL (ZESTRIL) 2.5 MG tablet Take 2.5 mg by mouth       psyllium, aspartame, (METAMUCIL) 3.4 gram packet Take 1 packet by mouth 2 (two) times daily       risperiDONE (RISPERDAL) 1 MG tablet Take 1-2 mg by mouth       TRESIBA FLEXTOUCH U-200 pen injector (concentration 200 units/mL) Inject 26 Units subcutaneously       apoaequorin (PREVAGEN) capsule Take 1 capsule by mouth once daily       aspirin 81 MG EC tablet Take 81 mg by mouth once daily       atorvastatin (LIPITOR) 40 MG tablet Take 40 mg by mouth once daily       biotin 5,000 mcg Subl         coenzyme Q10 100 mg Chew Take 100 mg by mouth       ferrous sulfate 324 mg (65 mg iron) EC tablet Take 324 mg by mouth       hydrOXYzine (ATARAX) 25 MG tablet Take 25 mg by mouth once daily as needed for Anxiety       lactobacillus comb no.10 (PROBIOTIC) 20  billion cell Cap         metFORMIN (GLUCOPHAGE) 1000 MG tablet Take 1,000 mg by mouth 2 (two) times daily       multivitamin-lutein (MULTIVITAMIN 50 PLUS) tablet         omeprazole (PRILOSEC) 40 MG DR capsule         OXcarbazepine (TRILEPTAL) 150 MG tablet         PARoxetine (PAXIL) 40 MG tablet         potassium gluconate 2.5 mEq Tab Take 99 mg by mouth       traZODone (DESYREL) 150 MG tablet          No current facility-administered medications on file prior to visit.           Family History  Problem Relation Age of Onset   Skin cancer Mother     Diabetes Mother     Skin cancer Father     Diabetes Father     Diabetes Sister     Diabetes Brother        Social History        Tobacco Use  Smoking Status Every Day   Types: Cigarettes  Smokeless Tobacco Never      Social History         Socioeconomic History   Marital status: Married  Tobacco Use   Smoking status: Every Day      Types: Cigarettes   Smokeless tobacco: Never  Substance and Sexual Activity   Drug use: Yes      Types: Other-see comments      Comment:  marijuana    Social Drivers of Acupuncturist Strain: Low Risk  (01/06/2022)    Received from [facility]    Overall Financial Resource Strain (CARDIA)     Difficulty of Paying Living Expenses: Not hard at all  Food Insecurity: No Food Insecurity (06/03/2021)    Received from [facility]    Hunger Vital Sign     Within the past 12 months, you worried that your food would run out before you got the money to buy more.: Never true     Within the past 12 months, the food you bought just didn't last and you didn't have money to get more.: Never true  Transportation Needs: No Transportation Needs (01/06/2022)    Received from [facility]    PRAPARE - Transportation     Lack of Transportation (Medical): No     Lack of Transportation (Non-Medical): No  Physical Activity: Insufficiently Active (06/03/2021)    Received from [facility]    Exercise Vital Sign     On average, how many days per week do you engage in moderate to strenuous exercise (like a brisk walk)?: 4 days     On average, how many minutes do you engage in exercise at this level?: 30 min  Stress: No Stress Concern Present (06/03/2021)    Received from [facility] - Occupational Stress Questionnaire     Feeling of Stress : Not at all  Social Connections: Socially Integrated (06/03/2021)    Received from [facility]    Social Connection and Isolation Panel     In a typical week, how many times do you talk on the phone with family, friends, or neighbors?: Twice a week     How often do you get together with  friends or relatives?: Twice a week     How often do you attend church or religious services?: 1 to 4 times per year     Do you belong to any clubs or organizations such as church groups, unions, fraternal or athletic groups, or school groups?: No     How often do you attend meetings of the clubs or organizations you belong to?: 1 to 4 times per  year     Are you married, widowed, divorced, separated, never married, or living with a partner?: Married      Objective:       Vitals:   07/12/24 0938  BP: 127/83  Pulse: 100  Resp: 16  Temp: 98.6 F (37 C)  SpO2: 93%      Exam Gen: NAD CV: RRR Pulm: CTA Abd: soft       Labs, Imaging and Diagnostic Testing: CT images and report reviewed.  There does appear to be a small mass in the anterior wall of the proximal transverse colon.   Assessment and Plan:  Colonic mass  (primary encounter diagnosis)   58 year old female with an enlarging colon wall mass.  It is felt to be incompletely biopsied endoscopically due to submucosal nature.  We discussed today attempting a endoscopic assisted robotic colon wall resection to remove the mass.  If this cannot be completed safely, we would proceed with a robotic partial colectomy.  We have discussed both these procedures in detail including risk associated with surgery and risk of leak.  All questions were answered. The surgery and anatomy were described to the patient as well as the risks of surgery and the possible complications.  These include: Bleeding, deep abdominal infections and possible wound complications such as hernia and infection, damage to adjacent structures, leak of surgical connections, which can lead to other surgeries and possibly an ostomy, possible need for other procedures, such as abscess drains in radiology, possible prolonged hospital stay, possible diarrhea from removal of part of the colon, possible constipation from narcotics, possible bowel, bladder or sexual dysfunction if having rectal surgery, prolonged fatigue/weakness or appetite loss, possible early recurrence of of disease, possible complications of their medical problems such as heart disease or arrhythmias or lung problems, death (less than 1%). I believe the patient understands and wishes to proceed with the surgery.      Bernarda JAYSON Ned, MD Colon and  Rectal Surgery [facility]

## 2024-07-12 NOTE — Op Note (Signed)
 07/12/2024  2:09 PM  PATIENT:  Laura Suarez  58 y.o. female  Patient Care Team: Rosan Jacquline NOVAK, NP as PCP - General (Nurse Practitioner) Cinderella Deatrice FALCON, MD as Consulting Physician (Gastroenterology)  PRE-OPERATIVE DIAGNOSIS:  colon wall mass  POST-OPERATIVE DIAGNOSIS:  colon wall mass   ROBOT-ASSISTED RESECTION OF COLON WALL MASS FLEXIBLE SIGMOIDOSCOPY    Surgeon(s): Debby Hila, MD Teresa Lonni HERO, MD  ASSISTANT: Dr Teresa   ANESTHESIA:   local and general  EBL:20ml  Total I/O In: 2150 [I.V.:1800; IV Piggyback:350] Out: 155 [Urine:125; Blood:30]  Delay start of Pharmacological VTE agent (>24hrs) due to surgical blood loss or risk of bleeding:  no  DRAINS: none   SPECIMEN:  Source of Specimen:  proximal transverse colon anterior colon wall mass  DISPOSITION OF SPECIMEN:  PATHOLOGY  COUNTS:  YES  PLAN OF CARE: Admit to inpatient   PATIENT DISPOSITION:  PACU - hemodynamically stable.  INDICATION:    58 y.o. F with benign appearing colon wall mass with endoscopically abnormal features.  Endoscopic biopsies have been unhelpful in determining the pathology.  I recommended excisional resection:  The anatomy & physiology of the digestive tract was discussed.  The pathophysiology was discussed.  Natural history risks without surgery was discussed.   I worked to give an overview of the disease and the frequent need to have multispecialty involvement.  I feel the risks of no intervention will lead to serious problems that outweigh the operative risks; therefore, I recommended a partial colectomy to remove the pathology.  Laparoscopic & open techniques were discussed.   Risks such as bleeding, infection, abscess, leak, reoperation, possible ostomy, hernia, heart attack, death, and other risks were discussed.  I noted a good likelihood this will help address the problem.   Goals of post-operative recovery were discussed as well.    The patient expressed  understanding & wished to proceed with surgery.  OR FINDINGS:   Patient had ~1 cm mass in the anterior wall of the proximal transverse colon   DESCRIPTION:   Informed consent was confirmed.  The patient underwent general anaesthesia without difficulty.  The patient was positioned appropriately.  VTE prevention in place.  The patient's abdomen was clipped, prepped, & draped in a sterile fashion.  Surgical timeout confirmed our plan.  The patient was positioned in reverse Trendelenburg.  Abdominal entry was gained using a Varies needle in the LUQ.  Entry was clean.  I induced carbon dioxide insufflation.  An 8mm robotic port was placed in the LUQ.  Camera inspection revealed no significant injury.  Extra ports were carefully placed under direct laparoscopic visualization.  I laparoscopically reflected the greater omentum and the upper abdomen the small bowel in the peilvis. The patient was appropriately positioned and the robot was docked to the patient's right side.  Instruments were placed under direct visualization.   I identified a large area of tattoo in the anterior transverse colon situated just underneath the gallbladder.  I bluntly separated the gallbladder from the colon and I mobilized the omentum off of the right transverse colon using the robotic vessel sealer.  We were unable to palpate or visualize any lesions externally.  At this point we attempted to do a flexible colonoscopy.  Due to her positioning, we were unable to get past the sigmoid colon.  The entire sigmoid was evaluated and no additional lesions were noted in the sigmoid or rectum.  The colon was desufflated.  We then returned to the robotic approach.  A colotomy was made in the colon at the tattoo site.  The lesion was identified and excised in a longitudinal incision using the robotic vessel sealer.  I then closed the colotomy with 2, 2-0 V-Loc sutures transversely using a running Cannel suture.  Hemostasis was good.  The  colon appeared patent and airtight. The abdomen was then irrigated with normal saline. The omentum was then brought down over the anastomosis.  At this point the robot was undocked.  The 12 mm suprapubic port was used to remove the specimen from the abdomen.  The ports were removed.  The fascia of the 12 mm port was then closed using a 0 Vicryl suture passed laparoscopically using a laparoscopic suture passer.  The subcutaneous tissue of the extraction incision was closed using a 2-0 Vicryl suture. The skin was then closed using a subcuticular interrupted 4-0 Vicryl sutures.  The remaining port sites were closed using interrupted 4-0 Vicryl sutures and Dermabond. All counts were correct per operating room staff. The patient was then awakened from anesthesia and sent to the post anesthesia care unit in stable condition.     Bernarda JAYSON Ned, MD  Colorectal and General Surgery [facility] Surgery

## 2024-07-13 ENCOUNTER — Encounter (HOSPITAL_COMMUNITY): Payer: Self-pay | Admitting: General Surgery

## 2024-07-13 LAB — CBC
HCT: 35.2 % — ABNORMAL LOW (ref 36.0–46.0)
Hemoglobin: 12.7 g/dL (ref 12.0–15.0)
MCH: 31.3 pg (ref 26.0–34.0)
MCHC: 36.1 g/dL — ABNORMAL HIGH (ref 30.0–36.0)
MCV: 86.7 fL (ref 80.0–100.0)
Platelets: 352 K/uL (ref 150–400)
RBC: 4.06 MIL/uL (ref 3.87–5.11)
RDW: 11.9 % (ref 11.5–15.5)
WBC: 17.2 K/uL — ABNORMAL HIGH (ref 4.0–10.5)
nRBC: 0 % (ref 0.0–0.2)

## 2024-07-13 LAB — BASIC METABOLIC PANEL WITH GFR
Anion gap: 13 (ref 5–15)
BUN: <5 mg/dL — ABNORMAL LOW (ref 6–20)
CO2: 22 mmol/L (ref 22–32)
Calcium: 8.5 mg/dL — ABNORMAL LOW (ref 8.9–10.3)
Chloride: 102 mmol/L (ref 98–111)
Creatinine, Ser: 0.65 mg/dL (ref 0.44–1.00)
GFR, Estimated: >60 mL/min (ref >60)
Glucose, Bld: 158 mg/dL — ABNORMAL HIGH (ref 70–99)
Potassium: 3.4 mmol/L — ABNORMAL LOW (ref 3.5–5.1)
Sodium: 136 mmol/L (ref 135–145)

## 2024-07-13 LAB — GLUCOSE, CAPILLARY
Glucose-Capillary: 230 mg/dL — ABNORMAL HIGH (ref 70–99)
Glucose-Capillary: 259 mg/dL — ABNORMAL HIGH (ref 70–99)
Glucose-Capillary: 62 mg/dL — ABNORMAL LOW (ref 70–99)
Glucose-Capillary: 103 mg/dL — ABNORMAL HIGH (ref 70–99)
Glucose-Capillary: 91 mg/dL (ref 70–99)
Glucose-Capillary: 154 mg/dL — ABNORMAL HIGH (ref 70–99)

## 2024-07-13 MED ORDER — OXYCODONE HCL 5 MG PO TABS: 5.0000 mg | ORAL_TABLET | ORAL | 0 refills | Status: DC | PRN

## 2024-07-13 MED ORDER — OXYCODONE HCL 5 MG PO TABS
5.0000 mg | ORAL_TABLET | ORAL | Status: DC | PRN
  Administered 2024-07-13 – 2024-07-14 (×3): 5 mg via ORAL
  Filled 2024-07-13 (×3): qty 1

## 2024-07-13 NOTE — Inpatient Diabetes Management (Signed)
 Inpatient Diabetes Program Recommendations  AACE/ADA: New Consensus Statement on Inpatient Glycemic Control (2015)  Target Ranges:  Prepandial:   less than 140 mg/dL      Peak postprandial:   less than 180 mg/dL (1-2 hours)      Critically ill patients:  140 - 180 mg/dL   Lab Results  Component Value Date   GLUCAP 259 (H) 07/13/2024   HGBA1C 5.9 (H) 07/05/2024    Review of Glycemic Control  Diabetes history: DM2 Outpatient Diabetes medications: metformin 1000 mg BID, Farxiga 5 mg QAM, Tresiba 26 units QHS Current orders for Inpatient glycemic control: Novolog 0-20 TID with meals and 0-5 HS, metformin 1000 mg BID  Inpatient Diabetes Program Recommendations:    Consider adding Lantus 12 units at bedtime  Continue to follow.  Thank you. Shona Brandy, RD, LDN, CDCES Inpatient Diabetes Coordinator [phone]

## 2024-07-13 NOTE — Plan of Care (Signed)
  Problem: Bowel/Gastric: Goal: Gastrointestinal status for postoperative course will improve Outcome: Progressing   

## 2024-07-13 NOTE — Progress Notes (Signed)
 Pharmacy Brief Note - Alvimopan (Entereg)  The standing order set for alvimopan (Entereg) now includes an automatic order to discontinue the drug after the patient has had a bowel movement. The change was approved by the Pharmacy & Therapeutics Committee and the Medical Executive Committee.   This patient has had bowel movements documented by nursing. Therefore, alvimopan has been discontinued. If there are questions, please contact the pharmacy at [phone].   Thank you-   Dolphus Roller, PharmD, BCPS 07/13/2024 12:51 PM

## 2024-07-13 NOTE — Progress Notes (Signed)
   07/13/24 1144  TOC Brief Assessment  Insurance and Status Reviewed  Patient has primary care physician Yes  Home environment has been reviewed resides in a private residence  Prior level of function: Independent  Prior/Current Home Services No current home services  Social Drivers of Health Review SDOH reviewed no interventions necessary  Readmission risk has been reviewed Yes  Transition of care needs no transition of care needs at this time

## 2024-07-13 NOTE — Progress Notes (Signed)
 1 Day Post-Op Robotic Colon Wall Resection Subjective: Having some soreness, had a BM.  Tolerating liquids  Objective: Vital signs in last 24 hours: Temp:  [96.5 F (35.8 C)-98.6 F (37 C)] 97.5 F (36.4 C) (10/03 0115) Pulse Rate:  [94-106] 95 (10/03 0115) Resp:  [15-19] 15 (10/03 0115) BP: (127-167)/(83-99) 157/97 (10/03 0115) SpO2:  [93 %-100 %] 99 % (10/03 0115) Weight:  [51.7 kg-57.9 kg] 57.9 kg (10/03 0500)   Intake/Output from previous day: 10/02 0701 - 10/03 0700 In: 2877.6 [P.O.:120; I.V.:2407.6; IV Piggyback:350] Out: 1205 [Urine:1175; Blood:30] Intake/Output this shift: No intake/output data recorded.   General appearance: alert and cooperative GI: normal findings: soft, non-distended  Incision: no significant drainage  Lab Results:  Recent Labs    07/13/24 0436  WBC 17.2*  HGB 12.7  HCT 35.2*  PLT 352   BMET Recent Labs    07/13/24 0436  NA 136  K 3.4*  CL 102  CO2 22  GLUCOSE 158*  BUN <5*  CREATININE 0.65  CALCIUM 8.5*   PT/INR No results for input(s): LABPROT, INR in the last 72 hours. ABG No results for input(s): PHART, HCO3 in the last 72 hours.  Invalid input(s): PCO2, PO2  MEDS, Scheduled  acetaminophen   1,000 mg Oral Q6H   alvimopan  12 mg Oral BID   atorvastatin  40 mg Oral QHS   busPIRone  10 mg Oral BID   enoxaparin (LOVENOX) injection  40 mg Subcutaneous Q24H   famotidine  20 mg Oral Daily   feeding supplement  237 mL Oral BID BM   gabapentin  300 mg Oral BID   insulin aspart  0-20 Units Subcutaneous TID WC   insulin aspart  0-5 Units Subcutaneous QHS   lisinopril  2.5 mg Oral q morning   metFORMIN  1,000 mg Oral BID WC   OXcarbazepine  300 mg Oral QHS   pantoprazole  80 mg Oral Daily   PARoxetine  40 mg Oral Daily   psyllium  1 packet Oral BID   risperiDONE  1 mg Oral Daily   risperiDONE  2 mg Oral QHS   saccharomyces boulardii  250 mg Oral BID   traZODone  150 mg Oral QHS    Studies/Results: No  results found.  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   Weight loss, abnormal 05/08/2024   Chronic idiopathic constipation 05/08/2024   Dyspepsia 05/08/2024   History of colon polyps 05/08/2024   Status post implantation of urinary electronic stimulator device 03/15/2024   Pulmonary nodules 11/03/2023   Tobacco use 11/03/2023   OAB (overactive bladder) 08/01/2023   Adrenal adenoma, left 06/15/2023   Colonic mass 09/24/2021   Persistent microscopic hematuria 08/10/2021   Urge incontinence of urine 07/08/2021   Mixed hyperlipidemia 10/26/2018   Hyperglycemia due to type 2 diabetes mellitus (HCC) 09/01/2018   Type 2 diabetes mellitus with hyperglycemia, with long-term current use of insulin (HCC) 09/01/2018   Bipolar disorder (HCC) 01/11/2018   Gastroesophageal reflux disease without esophagitis 01/11/2018    Expected post op course  Plan: Advance diet as tolerated Possible d/c home later today vs tomorrow   LOS: 1 day     .Bernarda JAYSON Ned, MD [facility], [place]    07/13/2024 8:22 AM

## 2024-07-13 NOTE — Progress Notes (Signed)
 Hypoglycemic Event  CBG: 61  Treatment: 4 oz juice/soda  Symptoms: Pale, Sweaty, and Shaky  Follow-up CBG: Time:1448 CBG Result:103  Possible Reasons for Event: Inadequate meal intake and Other: .  Comments/MD notified:Pt delayed lunchtime meal (waiting for family to bring her food from outside)    Verneita Hummer

## 2024-07-14 LAB — GLUCOSE, CAPILLARY
Glucose-Capillary: 87 mg/dL (ref 70–99)
Glucose-Capillary: 120 mg/dL — ABNORMAL HIGH (ref 70–99)
Glucose-Capillary: 141 mg/dL — ABNORMAL HIGH (ref 70–99)

## 2024-07-14 NOTE — Discharge Summary (Signed)
 Physician Discharge Summary  Patient ID: Amire Gossen MRN: [id number] DOB/AGE: Jul 07, 1966 58 y.o.  Admit date: 07/12/2024 Discharge date: 07/14/2024  Admission Diagnoses:  Discharge Diagnoses:  Principal Problem:   Colonic mass   Discharged Condition: good  Hospital Course: admitted for elective surgery. Did well post op.  Quick return of bowel function.  Pain controlled.  Discharged home POD# 2  Consults: None  Significant Diagnostic Studies:   Treatments: surgery: ROBOT-ASSISTED RESECTION OF COLON WALL MASS FLEXIBLE SIGMOIDOSCOPY  Discharge Exam: Blood pressure (!) 157/98, pulse (!) 107, temperature 98.1 F (36.7 C), temperature source Oral, resp. rate 18, height 5' 2 (1.575 m), weight 57.9 kg, SpO2 95%. General appearance: alert, cooperative, and no distress Resp: clear to auscultation bilaterally Cardio: regular rate and rhythm, S1, S2 normal, no murmur, click, rub or gallop Incision/Wound: abdomen soft, ND, incisions clean  Disposition: Discharge disposition: 01-Home or Self Care        Allergies as of 07/14/2024       Reactions   Sulfa Antibiotics Rash   Ziprasidone Hcl Other (See Comments)   Unable to move limbs        Medication List     TAKE these medications    aspirin EC 81 MG tablet Take 81 mg by mouth daily.   atorvastatin 40 MG tablet Commonly known as: LIPITOR Take 40 mg by mouth at bedtime.   Biotin 5000 MCG Caps Take 5,000 mcg by mouth daily.   busPIRone 10 MG tablet Commonly known as: BUSPAR Take 10 mg by mouth 2 (two) times daily.   Coenzyme Q10 100 MG Chew Chew 100 mg by mouth daily.   famotidine 40 MG tablet Commonly known as: PEPCID Take 40 mg by mouth daily.   Farxiga 5 MG Tabs tablet Generic drug: dapagliflozin propanediol Take 5 mg by mouth every morning.   hydrOXYzine 25 MG tablet Commonly known as: ATARAX Take 25 mg by mouth daily as needed for anxiety.   lisinopril 2.5 MG tablet Commonly known as:  ZESTRIL Take 2.5 mg by mouth every morning.   metFORMIN 1000 MG tablet Commonly known as: GLUCOPHAGE Take 1,000 mg by mouth 2 (two) times daily.   multivitamin with minerals Tabs tablet Take 1 tablet by mouth daily.   omeprazole 40 MG capsule Commonly known as: PRILOSEC Take 40 mg by mouth at bedtime.   OXcarbazepine 150 MG tablet Commonly known as: TRILEPTAL Take 300 mg by mouth at bedtime.   oxyCODONE  5 MG immediate release tablet Commonly known as: Oxy IR/ROXICODONE  Take 1 tablet (5 mg total) by mouth every 4 (four) hours as needed for moderate pain (pain score 4-6), severe pain (pain score 7-10) or breakthrough pain.   PARoxetine 40 MG tablet Commonly known as: PAXIL Take 40 mg by mouth daily.   Potassium 99 MG Tabs Take 99 mg by mouth in the morning and at bedtime.   PROBIOTIC PO Take 1 tablet by mouth daily.   psyllium 58.6 % packet Commonly known as: METAMUCIL Take 1 packet by mouth 2 (two) times daily.   risperiDONE 1 MG tablet Commonly known as: RISPERDAL Take 1-2 mg by mouth See admin instructions. Take 1 mg by mouth in the morning and 2 mg in the evening   traZODone 150 MG tablet Commonly known as: DESYREL Take 150 mg by mouth at bedtime.   Tresiba FlexTouch 200 UNIT/ML FlexTouch Pen Generic drug: insulin degludec Inject 26 Units into the skin at bedtime.        Follow-up Information  Debby Hila, MD. Schedule an appointment as soon as possible for a visit in 2 week(s).   Specialties: General Surgery, Colon and Rectal Surgery Contact information: [address] [place] [address] [phone]                 Signed: Vicenta Poli 07/14/2024, 10:33 AM

## 2024-07-14 NOTE — Progress Notes (Signed)
 Patient ID: Laura Suarez, female   DOB: 02/01/1966, 58 y.o.   MRN: [id number]   Feeling better today Having bm's Pain less Wants to go home  Looks good on exam Abdomen is soft  Plan: Discharge home

## 2024-07-14 NOTE — Progress Notes (Signed)
 Pt awake and stated she had been sweating. Pt's personal blood sugar alerted to BS of 56. Nurse used hospital provided glucometer to check BS. BS=87. Pt stated that she historically runs a low BS at night. Pt has been given a [place] sandwich to eat, jello and diet lemon-lime drink. Pt stated that she ate her dinner last night.

## 2024-07-16 LAB — SURGICAL PATHOLOGY

## 2024-07-23 NOTE — Addendum Note (Signed)
 Addendum  created 07/23/24 0659 by Dorethea Cordella SQUIBB, DO   Attestation recorded in Intraprocedure, Intraprocedure Attestations filed

## 2024-07-25 DIAGNOSIS — R000 Tachycardia, unspecified: Secondary | ICD-10-CM | POA: Diagnosis not present

## 2024-08-09 ENCOUNTER — Ambulatory Visit (INDEPENDENT_AMBULATORY_CARE_PROVIDER_SITE_OTHER): Admitting: Gastroenterology

## 2024-08-29 ENCOUNTER — Telehealth: Payer: Self-pay

## 2024-08-29 NOTE — Telephone Encounter (Signed)
 Called patient to reschedule visit was referred to lung cancer screening program for scan in 2026 Jan,needs call to schedule

## 2024-08-30 ENCOUNTER — Ambulatory Visit: Admitting: Emergency Medicine

## 2024-08-30 ENCOUNTER — Telehealth: Payer: Self-pay

## 2024-08-30 DIAGNOSIS — Z87891 Personal history of nicotine dependence: Secondary | ICD-10-CM

## 2024-08-30 DIAGNOSIS — Z122 Encounter for screening for malignant neoplasm of respiratory organs: Secondary | ICD-10-CM

## 2024-08-30 NOTE — Telephone Encounter (Signed)
 LCS scheduled for 10/24/2024

## 2024-08-30 NOTE — Telephone Encounter (Signed)
 Lung Cancer Screening Narrative/Criteria Questionnaire (Cigarette Smokers Only- No Cigars/Pipes/vapes)   Jacqualynn Stierwalt   SDMV: 09/17/2024 at 8:30 am Natalie    1966-02-28               LDCT: 10/25/2023 at 9:00 am at [facility]    58 y.o.   Phone: [phone]  Lung Screening Narrative (confirm age 40-77 yrs Medicare / 50-80 yrs Private pay insurance)   Insurance information: [employer] Medicare   Referring Provider: Rosan, MD   This screening involves an initial phone call with a team member from our program. It is called a shared decision making visit. The initial meeting is required by  insurance and Medicare to make sure you understand the program. This appointment takes about 15-20 minutes to complete. You will complete the screening scan at your scheduled date/time.  This scan takes about 5-10 minutes to complete. You can eat and drink normally before and after the scan.  Criteria questions for Lung Cancer Screening:   Are you a current or former smoker? Former Age began smoking: 61   If you are a former smoker, what year did you quit smoking? Quit 2019 plus additional 4 years (within 15 yrs)   To calculate your smoking history, I need an accurate estimate of how many packs of cigarettes you smoked per day and for how many years. (Not just the number of PPD you are now smoking)   Years smoking 28 x Packs per day 1 = Pack years 28   (at least 20 pack yrs)   (Make sure they understand that we need to know how much they have smoked in the past, not just the number of PPD they are smoking now)  Do you have a personal history of cancer?  No    Do you have a family history of cancer? No  Are you coughing up blood?  No  Have you had unexplained weight loss of 15 lbs or more in the last 6 months? No  It looks like you meet all criteria.  When would be a good time for us  to schedule you for this screening?   Additional information: N/A

## 2024-09-10 ENCOUNTER — Encounter (INDEPENDENT_AMBULATORY_CARE_PROVIDER_SITE_OTHER): Payer: Self-pay

## 2024-09-10 ENCOUNTER — Ambulatory Visit (INDEPENDENT_AMBULATORY_CARE_PROVIDER_SITE_OTHER): Admitting: Gastroenterology

## 2024-09-17 ENCOUNTER — Ambulatory Visit: Admitting: *Deleted

## 2024-09-17 ENCOUNTER — Encounter: Payer: Self-pay | Admitting: *Deleted

## 2024-09-17 DIAGNOSIS — F17210 Nicotine dependence, cigarettes, uncomplicated: Secondary | ICD-10-CM

## 2024-09-17 DIAGNOSIS — Z720 Tobacco use: Secondary | ICD-10-CM

## 2024-09-17 NOTE — Patient Instructions (Signed)

## 2024-09-17 NOTE — Progress Notes (Signed)
 Virtual Visit via Video Note  I connected with Laura Suarez on 09/17/24 at  8:30 AM EST by a video enabled telemedicine application and verified that I am speaking with the correct person using two identifiers.  Location: Patient: at home Provider: [address], [place], [place], [address]    I discussed the limitations of evaluation and management by telemedicine and the availability of in person appointments. The patient expressed understanding and agreed to proceed.   Shared Decision Making Visit Lung Cancer Screening Program [phone])   Eligibility: Age 58 y.o. Pack Years Smoking History Calculation 37 (# packs/per year x # years smoked) Recent History of coughing up blood  yes Unexplained weight loss? no ( >Than 15 pounds within the last 6 months ) Prior History Lung / other cancer no (Diagnosis within the last 5 years already requiring surveillance chest CT Scans). Smoking Status Current Smoker Former Smokers: Years since quit: n/a  Quit Date: n/a  Visit Components: Discussion included one or more decision making aids. yes Discussion included risk/benefits of screening. yes Discussion included potential follow up diagnostic testing for abnormal scans. yes Discussion included meaning and risk of over diagnosis. yes Discussion included meaning and risk of False Positives. yes Discussion included meaning of total radiation exposure. yes  Counseling Included: Importance of adherence to annual lung cancer LDCT screening. yes Impact of comorbidities on ability to participate in the program. yes Ability and willingness to under diagnostic treatment. yes  Smoking Cessation Counseling: Current Smokers:  Discussed importance of smoking cessation. yes Information about tobacco cessation classes and interventions provided to patient. yes Patient provided with ticket for LDCT Scan. no Symptomatic Patient. no   Diagnosis Code: Tobacco Use Z72.0 Asymptomatic Patient  yes Smoking/Tobacco Cessation Counseling [name] is a current user of tobacco or nicotine products. She is ready to quit at this time. Counseling provided today addressed the risks of continued use and the benefits of cessation. Discussed tobacco/nicotine use history, readiness to quit, and evidence-based treatment options including behavioral strategies, support resources, and pharmacologic therapies. Provided encouragement and educational materials on steps and resources to quit smoking. Patient questions were addressed, and follow-up recommended for continued support. Total time spent on counseling: 4 minutes.   Former Smokers:  Discussed the importance of maintaining cigarette abstinence. yes Diagnosis Code: Personal History of Nicotine Dependence. S12.108 Information about tobacco cessation classes and interventions provided to patient. Yes Patient provided with ticket for LDCT Scan. no Written Order for Lung Cancer Screening with LDCT placed in Epic. Yes (CT Chest Lung Cancer Screening Low Dose W/O CM) [id number] Z12.2-Screening of respiratory organs Z87.891-Personal history of nicotine dependence   [name], RN

## 2024-10-15 ENCOUNTER — Other Ambulatory Visit: Payer: Self-pay | Admitting: Gastroenterology

## 2024-10-24 ENCOUNTER — Ambulatory Visit (HOSPITAL_COMMUNITY)
Admission: RE | Admit: 2024-10-24 | Discharge: 2024-10-24 | Disposition: A | Discharge status: Home / Self Care | Source: Ambulatory Visit | Attending: Acute Care | Admitting: Acute Care

## 2024-10-24 ENCOUNTER — Encounter: Payer: Self-pay | Admitting: *Deleted

## 2024-10-24 DIAGNOSIS — Z87891 Personal history of nicotine dependence: Secondary | ICD-10-CM | POA: Diagnosis present

## 2024-10-24 DIAGNOSIS — Z122 Encounter for screening for malignant neoplasm of respiratory organs: Secondary | ICD-10-CM | POA: Insufficient documentation

## 2024-10-25 ENCOUNTER — Ambulatory Visit: Attending: Cardiology | Admitting: Cardiology

## 2024-10-25 ENCOUNTER — Encounter: Payer: Self-pay | Admitting: *Deleted

## 2024-10-25 ENCOUNTER — Encounter: Payer: Self-pay | Admitting: Cardiology

## 2024-10-25 VITALS — BP 104/70 | HR 110 | Ht 62.0 in | Wt 113.6 lb

## 2024-10-25 DIAGNOSIS — R000 Tachycardia, unspecified: Secondary | ICD-10-CM

## 2024-10-25 DIAGNOSIS — Z01812 Encounter for preprocedural laboratory examination: Secondary | ICD-10-CM | POA: Diagnosis not present

## 2024-10-25 DIAGNOSIS — R079 Chest pain, unspecified: Secondary | ICD-10-CM

## 2024-10-25 MED ORDER — METOPROLOL TARTRATE 100 MG PO TABS: 100.0000 mg | ORAL_TABLET | Freq: Once | ORAL | 0 refills | Status: AC

## 2024-10-25 NOTE — Progress Notes (Signed)
 "     Clinical Summary Laura Suarez is a 59 y.o.female seen as a new consult, referred by Dr Rosamond for the following medical problems  1.Tachycardia  - pcp completed outpatient monitor - 07/2024 14 day zio patch: Rare ectopy, isolated 5 beat run of SVT. Though some marked sinus tach episodes may be SVT with somw. T wave inversions with tachcyardia   Jan 2025 CT chest: age advanced CAD of LAD  - feeling of heart racing at times.  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 Chest pains - upper left chest, dull aching pain 7/10 in severity. Typically at rest. No other associated symptoms. Not positional. Pain lasts 10-30 minutes.  - fairly infrequent symptoms. - sedentary. Highest level activity is cleaning house, can sometimes get SOB.   CAD risk factors: DM2, HDL, former smoker x roughly 30 years  Past Medical History:  Diagnosis Date   Anxiety    Bipolar disorder (HCC)    Depression    Diabetes mellitus without complication (HCC)    High cholesterol      Allergies[1]   Current Outpatient Medications  Medication Sig Dispense Refill   aspirin 81 MG EC tablet Take 81 mg by mouth daily.     atorvastatin  (LIPITOR) 40 MG tablet Take 40 mg by mouth at bedtime.     Biotin 5000 MCG CAPS Take 5,000 mcg by mouth daily.     busPIRone  (BUSPAR ) 10 MG tablet Take 10 mg by mouth 2 (two) times daily.     Coenzyme Q10 100 MG CHEW Chew 100 mg by mouth daily.     famotidine  (PEPCID ) 40 MG tablet Take 40 mg by mouth daily.     FARXIGA 5 MG TABS tablet Take 5 mg by mouth every morning.     hydrOXYzine (ATARAX) 25 MG tablet Take 25 mg by mouth daily as needed for anxiety.     lisinopril  (ZESTRIL ) 2.5 MG tablet Take 2.5 mg by mouth every morning.     metFORMIN  (GLUCOPHAGE ) 1000 MG tablet Take 1,000 mg by mouth 2 (two) times daily.      Multiple Vitamin (MULTIVITAMIN WITH MINERALS) TABS tablet Take 1 tablet by mouth daily.     omeprazole (PRILOSEC) 40 MG capsule Take 40 mg by mouth at bedtime.     OXcarbazepine  (TRILEPTAL ) 150 MG tablet Take 300 mg by mouth at bedtime.     oxyCODONE  (OXY IR/ROXICODONE ) 5 MG immediate release tablet Take 1 tablet (5 mg total) by mouth every 4 (four) hours as needed for moderate pain (pain score 4-6), severe pain (pain score 7-10) or breakthrough pain. 15 tablet 0   PARoxetine  (PAXIL ) 40 MG tablet Take 40 mg by mouth daily.     Potassium 99 MG TABS Take 99 mg by mouth in the morning and at bedtime.     Probiotic Product (PROBIOTIC PO) Take 1 tablet by mouth daily.     risperiDONE  (RISPERDAL ) 1 MG tablet Take 1-2 mg by mouth See admin instructions. Take 1 mg by mouth in the morning and 2 mg in the evening     traZODone  (DESYREL ) 150 MG tablet Take 150 mg by mouth at bedtime.     TRESIBA FLEXTOUCH 200 UNIT/ML FlexTouch Pen Inject 26 Units into the skin at bedtime.     No current facility-administered  medications for this visit.     Past Surgical History:  Procedure Laterality Date   ABDOMINAL HYSTERECTOMY     BIOPSY  05/16/2023   Procedure: BIOPSY;  Surgeon: Cinderella Deatrice FALCON, MD;  Location: [facility];  Service: Endoscopy;;   COLONOSCOPY N/A 05/24/2024   Procedure: COLONOSCOPY;  Surgeon: Cinderella Deatrice FALCON, MD;  Location: [facility];  Service: Endoscopy;  Laterality: N/A;  11:15 am, asa 1-2   COLONOSCOPY N/A 07/12/2024   Procedure: COLONOSCOPY;  Surgeon: Debby Hila, MD;  Location: [facility];  Service: General;  Laterality: N/A;   COLONOSCOPY WITH PROPOFOL  N/A 05/16/2023   Procedure: COLONOSCOPY WITH PROPOFOL ;  Surgeon: Cinderella Deatrice FALCON, MD;  Location: [facility];  Service: Endoscopy;  Laterality: N/A;  7:30AM;ASA 2   INTERSTIM IMPLANT PLACEMENT N/A 08/01/2023   Procedure: RENNA IMPLANT FIRST STAGE;  Surgeon: Sherrilee Belvie CROME, MD;  Location: [facility];  Service: Urology;  Laterality:  N/A;   INTERSTIM IMPLANT PLACEMENT N/A 09/13/2023   Procedure: RENNA IMPLANT SECOND STAGE;  Surgeon: Sherrilee Belvie CROME, MD;  Location: [facility];  Service: Urology;  Laterality: N/A;     Allergies[2]    No family history on file.   Social History Laura Suarez reports that she has been smoking cigars and cigarettes. She started smoking about 38 years ago. She has a 38 pack-year smoking history. She has quit using smokeless tobacco. Laura Suarez reports that she does not currently use alcohol.    Physical Examination Today's Vitals   10/25/24 1403  BP: 104/70  Pulse: (!) 110  SpO2: 94%  Weight: 113 lb 9.6 oz (51.5 kg)  Height: 5' 2 (1.575 m)   Body mass index is 20.78 kg/m.  Gen: resting comfortably, no acute distress HEENT: no scleral icterus, pupils equal round and reactive, no palptable cervical adenopathy,  CV: regular, tachy, no mrg, no jvd Resp: Clear to auscultation bilaterally GI: abdomen is soft, non-tender, non-distended, normal bowel sounds, no hepatosplenomegaly MSK: extremities are warm, no edema.  Skin: warm, no rash Neuro:  no focal deficits Psych: appropriate affect     Assessment and Plan  1.Palpitations - infrequent symptoms - monitor shows fairly infrequent SVT, rare ectopy. Avg HR was 103 on monitor - in absence of significant symptoms no strong indication for av nodal agent - EKG today shows sinus tach 110  2. Chest pain - multiple CAD risk factrors - chest CT has shown age advanced coronary atherosclerosis - monitor showed significant Twave inversions with elevated heart rates - will plan for coronary CTA to further evaluate       Dorn PHEBE Ross, M.D.     [1]  Allergies Allergen Reactions   Sulfa Antibiotics Rash   Ziprasidone Hcl Other (See Comments)    Unable to move limbs    [2]  Allergies Allergen Reactions   Sulfa Antibiotics Rash   Ziprasidone Hcl Other (See Comments)    Unable to move limbs     "

## 2024-10-25 NOTE — Patient Instructions (Signed)
 Medication Instructions:   Continue all current medications.   Labwork:  BMET - order given today Please do at [facility] 2-3 days prior to CTA  Testing/Procedures:  Coronary CTA   Follow-Up:  Office will contact with results via phone, letter or mychart.    Pending test results   Any Other Special Instructions Will Be Listed Below (If Applicable).   If you need a refill on your cardiac medications before your next appointment, please call your pharmacy.

## 2024-10-30 ENCOUNTER — Ambulatory Visit (INDEPENDENT_AMBULATORY_CARE_PROVIDER_SITE_OTHER): Admitting: Gastroenterology

## 2024-10-30 ENCOUNTER — Encounter (INDEPENDENT_AMBULATORY_CARE_PROVIDER_SITE_OTHER): Payer: Self-pay | Admitting: Gastroenterology

## 2024-10-30 VITALS — BP 124/75 | HR 115 | Temp 97.8°F | Ht 62.0 in | Wt 114.0 lb

## 2024-10-30 DIAGNOSIS — R140 Abdominal distension (gaseous): Secondary | ICD-10-CM

## 2024-10-30 DIAGNOSIS — K5900 Constipation, unspecified: Secondary | ICD-10-CM

## 2024-10-30 DIAGNOSIS — K5904 Chronic idiopathic constipation: Secondary | ICD-10-CM

## 2024-10-30 NOTE — Progress Notes (Signed)
 "  Referring Provider: Rosan Jacquline NOVAK, NP Primary Care Physician:  Rosan Jacquline NOVAK, NP Primary GI Physician: Dr. Cinderella   Chief Complaint  Patient presents with   Follow-up    Patient here today as a follow up on dyspepsia and bloating. She has constipation alternating with diarrhea. She says she has pressure in rectum. She is taking metamucil bid. Has upper abdominal pain. She does take omeprazole 40 mg daily, and also famotidine  40 mg daily.   HPI:   Laura Suarez is a 59 y.o. female with past medical history of hypertension, diabetes, depression   Patient presenting today for:  Follow up of bloating and constipation   Last seen July, at that time having explosive diarrhea, 2-3 BMs per day, previously having constipation.   Recommend metamucil BID, continue PPI daily, gas x/beano, CT A/P with contrast, colonoscopy, consider SIBO testing in future, low FODMAP diet  CT A/P with contrast: 05/2024: No acute findings. Mild colonic diverticulosis, but without signs of diverticulitis.  Last colonoscopy 05/2024:  - One 3 mm polyp in the ascending colon, removed                            with a cold snare. Resected and retrieved.                           - Two non-bleeding colonic angioectasias.                           - Rule out malignancy, umblicated with centrally                            ulceration polypoid ?submucosal lesion in the                            proximal transverse colon. Tattooed and biopsied .                            This lesion was seen 1 year ago during screening                            colonoscopy and biopsies at that time suggested                            superficial hyperplastic changes. Today this                            appears to be larger with ulcerated center . NBMI                            with pit pattern similar to surrounding colon                            mucosa. 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                             Recent CT Abdomen was reported to be negative for  any mass                           -Random colon biopsies obtained to rule out                            microscopic colitis                           - Non-bleeding internal hemorrhoids. A. COLON, ASCENDING, POLYPECTOMY:  - Tubular adenoma  - Negative for high-grade dysplasia or malignancy   B. COLON, TRANSVERSE, BIOPSY:  - Fragments of colonic mucosa with nonspecific inflammatory and  hyperplastic changes  - Negative for dysplasia or malignancy in the submitted material  - See comment   C. COLON, RANDOM, BIOPSY:  - Colonic mucosa with no specific histopathologic changes  - Negative for acute inflammation, increased intraepithelial lymphocytes  or thickened subepithelial collagen table   Patient referred for colorectal surgery evaluation  Underwent endoscopic assisted colon wall resection on 07/12/24: COLON WALL MASS, PROXIMAL, TRANSVERSE, COLECTOMY:  - Segment of colon with submucosal lipoma and overlying reactive mucosal  changes.  - Tattoo ink.  - Negative for dysplasia and malignancy.    Present: She states after her surgery, bowels were moving well but now she is more constipated. Has a lot of pressure in her rectum and gas. She took a laxative yesterday because she felt so constipated. She is taking metamucil BID, not drinking a lot of water . She states she took linzess in the past but would have fecal incontinence on this though unsure of dose. She is having some larger volume of diarrhea at times as well. She endorses mostly lower abdominal pain but sometimes epigastric pain but feels GERD is well managed on PPI and H2B. She has some vomiting. Appetite is not great. She is unsure of the last time she had a good BM where she felt emptied out. No rectal bleeding or melena. She has done miralax  in the past and did well on this.    Last EGD:12/2022 - Gastritis. Biopsied.   - Normal esophagus.   - Normal first portion of the duodenum and second portion of the duodenum.  Past Medical History:  Diagnosis Date   Anxiety    Bipolar disorder (HCC)    Depression    Diabetes mellitus without complication (HCC)    High cholesterol     Past Surgical History:  Procedure Laterality Date   ABDOMINAL HYSTERECTOMY     BIOPSY  05/16/2023   Procedure: BIOPSY;  Surgeon: Cinderella Deatrice FALCON, MD;  Location: [facility];  Service: Endoscopy;;   COLONOSCOPY N/A 05/24/2024   Procedure: COLONOSCOPY;  Surgeon: Cinderella Deatrice FALCON, MD;  Location: [facility];  Service: Endoscopy;  Laterality: N/A;  11:15 am, asa 1-2   COLONOSCOPY N/A 07/12/2024   Procedure: COLONOSCOPY;  Surgeon: Debby Hila, MD;  Location: [facility];  Service: General;  Laterality: N/A;   COLONOSCOPY WITH PROPOFOL  N/A 05/16/2023   Procedure: COLONOSCOPY WITH PROPOFOL ;  Surgeon: Cinderella Deatrice FALCON, MD;  Location: [facility];  Service: Endoscopy;  Laterality: N/A;  7:30AM;ASA 2   INTERSTIM IMPLANT PLACEMENT N/A 08/01/2023   Procedure: RENNA IMPLANT FIRST STAGE;  Surgeon: Sherrilee Belvie CROME, MD;  Location: [facility];  Service: Urology;  Laterality: N/A;   INTERSTIM IMPLANT PLACEMENT N/A 09/13/2023   Procedure: INTERSTIM IMPLANT SECOND STAGE;  Surgeon:  McKenzie, Belvie CROME, MD;  Location: [facility];  Service: Urology;  Laterality: N/A;    Current Outpatient Medications  Medication Sig Dispense Refill   aspirin 81 MG EC tablet Take 81 mg by mouth daily.     atorvastatin  (LIPITOR) 40 MG tablet Take 40 mg by mouth at bedtime.     Biotin 5000 MCG CAPS Take 5,000 mcg by mouth daily.     busPIRone  (BUSPAR ) 10 MG tablet Take 10 mg by mouth 2 (two) times daily.     Coenzyme Q10 100 MG CHEW Chew 100 mg by mouth daily.     famotidine  (PEPCID ) 40 MG tablet Take 40 mg by mouth daily.     FARXIGA 5 MG TABS tablet Take 5 mg by mouth every morning.     hydrOXYzine (ATARAX) 25 MG tablet Take 25 mg by mouth daily as needed for anxiety.     lisinopril   (ZESTRIL ) 2.5 MG tablet Take 2.5 mg by mouth every morning.     metFORMIN  (GLUCOPHAGE ) 1000 MG tablet Take 1,000 mg by mouth 2 (two) times daily.     metoprolol  tartrate (LOPRESSOR ) 100 MG tablet Take 1 tablet (100 mg total) by mouth once. (Patient taking differently: Take 100 mg by mouth daily at 6 (six) AM.) 1 tablet 0   Multiple Vitamin (MULTIVITAMIN WITH MINERALS) TABS tablet Take 1 tablet by mouth daily.     omeprazole (PRILOSEC) 40 MG capsule Take 40 mg by mouth at bedtime.     OXcarbazepine  (TRILEPTAL ) 150 MG tablet Take 300 mg by mouth at bedtime.     PARoxetine  (PAXIL ) 40 MG tablet Take 40 mg by mouth daily.     Potassium 99 MG TABS Take 99 mg by mouth in the morning and at bedtime.     Probiotic Product (PROBIOTIC PO) Take 1 tablet by mouth daily.     risperiDONE  (RISPERDAL ) 1 MG tablet Take 1-2 mg by mouth See admin instructions. Take 1 mg by mouth in the morning and 2 mg in the evening     traZODone  (DESYREL ) 150 MG tablet Take 150 mg by mouth at bedtime.     TRESIBA FLEXTOUCH 200 UNIT/ML FlexTouch Pen Inject 26 Units into the skin at bedtime.     No current facility-administered medications for this visit.    Allergies as of 10/30/2024 - Review Complete 10/30/2024  Allergen Reaction Noted   Sulfa antibiotics Rash 06/24/2020   Ziprasidone hcl Other (See Comments) 06/24/2020    Social History   Socioeconomic History   Marital status: Married    Spouse name: Not on file   Number of children: Not on file   Years of education: Not on file   Highest education level: Not on file  Occupational History   Not on file  Tobacco Use   Smoking status: Every Day    Types: Cigars   Smokeless tobacco: Former   Tobacco comments:    3 cigars per day   Vaping Use   Vaping status: Never Used  Substance and Sexual Activity   Alcohol use: Not Currently   Drug use: Yes    Types: Marijuana   Sexual activity: Not on file  Other Topics Concern   Not on file  Social History Narrative    Not on file   Social Drivers of Health   Tobacco Use: High Risk (10/30/2024)   Patient History    Smoking Tobacco Use: Every Day    Smokeless Tobacco Use: Former    Passive Exposure: Not  on file  Financial Resource Strain: Low Risk (01/06/2022)   [facility]   Overall Financial Resource Strain (CARDIA)    Difficulty of Paying Living Expenses: Not hard at all  Food Insecurity: No Food Insecurity (07/12/2024)   Epic    Worried About Programme Researcher, Broadcasting/film/video in the Last Year: Never true    Ran Out of Food in the Last Year: Never true  Transportation Needs: No Transportation Needs (07/12/2024)   Epic    Lack of Transportation (Medical): No    Lack of Transportation (Non-Medical): No  Physical Activity: Not on file  Stress: Not on file  Social Connections: Not on file  Depression (EYV7-0): Not on file  Alcohol Screen: Not on file  Housing: Low Risk (07/12/2024)   Epic    Unable to Pay for Housing in the Last Year: No    Number of Times Moved in the Last Year: 0    Homeless in the Last Year: No  Utilities: Not At Risk (07/12/2024)   Epic    Threatened with loss of utilities: No  Health Literacy: Low Risk (11/09/2022)   Received from [facility]    How often do you need to have someone help you when you read instructions, pamphlets, or other written material from your doctor or pharmacy?: Never    Review of systems General: negative for malaise, night sweats, fever, chills, weight loss Neck: Negative for lumps, goiter, pain and significant neck swelling Resp: Negative for cough, wheezing, dyspnea at rest CV: Negative for chest pain, leg swelling, palpitations, orthopnea GI: denies melena, hematochezia, nausea, vomiting,  dysphagia, odyonophagia, early satiety or unintentional weight loss. +constipation +diarrhea +abdominal pain  MSK: Negative for joint pain or swelling, back pain, and muscle pain. Derm: Negative for itching or rash Psych: Denies  depression, anxiety, memory loss, confusion. No homicidal or suicidal ideation.  Heme: Negative for prolonged bleeding, bruising easily, and swollen nodes. Endocrine: Negative for cold or heat intolerance, polyuria, polydipsia and goiter. Neuro: negative for tremor, gait imbalance, syncope and seizures. The remainder of the review of systems is noncontributory.  Physical Exam: BP 124/75 (BP Location: Left Arm, Patient Position: Sitting, Cuff Size: Normal)   Pulse (!) 115   Temp 97.8 F (36.6 C) (Temporal)   Ht 5' 2 (1.575 m)   Wt 114 lb (51.7 kg)   BMI 20.85 kg/m  General:   Alert and oriented. No distress noted. Pleasant and cooperative.  Head:  Normocephalic and atraumatic. Eyes:  Conjuctiva clear without scleral icterus. Mouth:  Oral mucosa pink and moist. Good dentition. No lesions. Heart: Normal rate and rhythm, s1 and s2 heart sounds present.  Lungs: Clear lung sounds in all lobes. Respirations equal and unlabored. Abdomen:  +BS, soft, and non-distended. TTP of epigastric region and lower abdomen. No rebound or guarding. No HSM or masses noted. Derm: No palmar erythema or jaundice Msk:  Symmetrical without gross deformities. Normal posture. Extremities:  Without edema. Neurologic:  Alert and  oriented x4 Psych:  Alert and cooperative. Normal mood and affect.  Invalid input(s): 6 MONTHS   ASSESSMENT: Laura Suarez is a 59 y.o. female presenting today for follow up of constipation and bloating  Improvement in constipation after colon wall resection though now having more constipation with some diarrhea. Taking metamucil BID but little water  intake, laxatives PRN. She feels bloated and has some vomiting. Suspect we are dealing with overflow diarrhea in setting of constipation. Did not tolerate  linzess in the past though unclear what dose she was on. For now, will start miralax  and uptitrate as instructed, continue metamucil, we discussed importance of good water  intake  especially with metamucil. Will consider alternative therapies such as Ibsrela if she fails miralax +metamucil.    PLAN:  -continue metamucil BID -Start taking Miralax  1 capful every day for one week. If bowel movements do not improve, increase to 1 capful every 12 hours. If after two weeks there is no improvement, increase to 1 capful every 8 hours -Increase water  intake, aim for atleast 64 oz per day -Increase fruits, veggies and whole grains, kiwi and prunes are especially good for constipation -pt to make me aware how bowels are moving after 2-3 weeks on miralax , consider other agents at that time such as Damon lou  All questions were answered, patient verbalized understanding and is in agreement with plan as outlined above.    Follow Up: 2 months   Sian Rockers L. Ashaunti Treptow, MSN, APRN, AGNP-C Adult-Gerontology Nurse Practitioner [facility]  "

## 2024-10-30 NOTE — Patient Instructions (Signed)
 Continue metamucil twice daily Increase water  intake, aim for atleast 60 oz per day Increase fruits, veggies and whole grains, kiwi and prunes are especially good for constipation Start taking Miralax  1 capful every day for one week. If bowel movements do not improve, increase to 1 capful every 12 hours. If after two weeks there is no improvement, increase to 1 capful every 8 hours  If bowels are not moving better in the next 2-3 weeks, please give me a call  Follow up 2 months  It was a pleasure to see you today. I want to create trusting relationships with patients and provide genuine, compassionate, and quality care. I truly value your feedback! please be on the lookout for a survey regarding your visit with me today. I appreciate your input about our visit and your time in completing this!    Arraya Buck L. Yessica Putnam, MSN, APRN, AGNP-C Adult-Gerontology Nurse Practitioner [facility]

## 2024-11-02 ENCOUNTER — Telehealth: Payer: Self-pay | Admitting: Acute Care

## 2024-11-02 NOTE — Telephone Encounter (Signed)
 This was read as a LR 2.  When compared to Super D CT Chest 10/25/2023, this area was read as a 7 mm somewhat spiculated RLL nodule at the time. It is now measuring 10 mm. In 1 year this has increased in size by 3 mm. Pt. Has also had weight loss of 10 lbs over 6 months. This may be related to diarrhea, but out of utmost caution we will do a 6 month scan to ensure no further growth.  Please fax results to PCP and let them know plan of care.  Next scan is due after 04/23/2025. Thanks so much

## 2024-11-05 ENCOUNTER — Other Ambulatory Visit: Payer: Self-pay

## 2024-11-05 DIAGNOSIS — R911 Solitary pulmonary nodule: Secondary | ICD-10-CM

## 2024-11-05 DIAGNOSIS — Z87891 Personal history of nicotine dependence: Secondary | ICD-10-CM

## 2024-11-05 DIAGNOSIS — Z122 Encounter for screening for malignant neoplasm of respiratory organs: Secondary | ICD-10-CM

## 2024-11-05 NOTE — Telephone Encounter (Addendum)
 Spoke with patient and reviewed recent Lung CT results. She is in a agreement to complete a 6 month follow up scan in 6 months to evaluate nodule stability. She currently weighs 114 lbs, previous weight 124 lbs in July 2025. States she has had some weight lose but does contribute to stomach issues. Order placed and she was scheduled for 7/15/206 at [facility]. Results and plan to PCP.

## 2024-11-06 ENCOUNTER — Other Ambulatory Visit: Payer: Self-pay | Admitting: Acute Care

## 2024-11-06 DIAGNOSIS — Z122 Encounter for screening for malignant neoplasm of respiratory organs: Secondary | ICD-10-CM

## 2024-11-06 DIAGNOSIS — R911 Solitary pulmonary nodule: Secondary | ICD-10-CM

## 2024-11-06 DIAGNOSIS — Z87891 Personal history of nicotine dependence: Secondary | ICD-10-CM

## 2024-11-08 ENCOUNTER — Ambulatory Visit: Admitting: Emergency Medicine

## 2024-11-12 ENCOUNTER — Encounter (HOSPITAL_COMMUNITY): Payer: Self-pay

## 2024-11-13 ENCOUNTER — Ambulatory Visit (HOSPITAL_COMMUNITY)

## 2024-11-22 ENCOUNTER — Ambulatory Visit: Admitting: Emergency Medicine

## 2024-11-29 ENCOUNTER — Ambulatory Visit (HOSPITAL_COMMUNITY)

## 2024-12-20 ENCOUNTER — Ambulatory Visit (INDEPENDENT_AMBULATORY_CARE_PROVIDER_SITE_OTHER): Admitting: Gastroenterology

## 2025-04-01 ENCOUNTER — Other Ambulatory Visit (HOSPITAL_COMMUNITY)

## 2025-04-18 ENCOUNTER — Ambulatory Visit: Admitting: Urology

## 2025-04-24 ENCOUNTER — Ambulatory Visit (HOSPITAL_COMMUNITY)
